# Patient Record
Sex: Female | Born: 1990 | Race: White | Hispanic: No | Marital: Married | Smoking: Never smoker
Health system: Southern US, Community
[De-identification: ages and names within clinical notes are randomized; demographics above are authoritative.]

## PROBLEM LIST (undated history)

## (undated) DIAGNOSIS — R519 Headache, unspecified: Secondary | ICD-10-CM

## (undated) DIAGNOSIS — N920 Excessive and frequent menstruation with regular cycle: Secondary | ICD-10-CM

## (undated) DIAGNOSIS — O24419 Gestational diabetes mellitus in pregnancy, unspecified control: Secondary | ICD-10-CM

## (undated) DIAGNOSIS — R51 Headache: Secondary | ICD-10-CM

## (undated) HISTORY — PX: TONSILLECTOMY: SHX5217

## (undated) HISTORY — DX: Gestational diabetes mellitus in pregnancy, unspecified control: O24.419

## (undated) HISTORY — DX: Headache: R51

## (undated) HISTORY — PX: TONSILLECTOMY: SUR1361

## (undated) HISTORY — DX: Headache, unspecified: R51.9

## (undated) HISTORY — DX: Excessive and frequent menstruation with regular cycle: N92.0

## (undated) HISTORY — PX: ARTHROSCOPIC REPAIR ACL: SUR80

---
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
## 2010-04-07 ENCOUNTER — Encounter: Payer: Self-pay | Admitting: Family Medicine

## 2012-03-04 ENCOUNTER — Ambulatory Visit (INDEPENDENT_AMBULATORY_CARE_PROVIDER_SITE_OTHER): Payer: BC Managed Care – PPO | Admitting: Internal Medicine

## 2012-03-04 ENCOUNTER — Encounter: Payer: Self-pay | Admitting: Internal Medicine

## 2012-03-04 VITALS — BP 128/82 | HR 93 | Temp 98.0°F | Ht 66.0 in | Wt 147.0 lb

## 2012-03-04 DIAGNOSIS — Z0000 Encounter for general adult medical examination without abnormal findings: Secondary | ICD-10-CM

## 2012-03-04 DIAGNOSIS — Z23 Encounter for immunization: Secondary | ICD-10-CM

## 2012-03-04 DIAGNOSIS — Z789 Other specified health status: Secondary | ICD-10-CM

## 2012-03-04 MED ORDER — TYPHOID VACCINE PO CPDR: 1.0000 | DELAYED_RELEASE_CAPSULE | ORAL | Status: DC

## 2012-03-24 NOTE — Progress Notes (Signed)
  Subjective:    Patient ID: CASSADY STANCZAK, female    DOB: March 07, 1991, 22 y.o.   MRN: [id number]  HPI  22yo F, no signficant past med hx going on extensive trip for 11 months. Leaving in July 2014-- Which will involve working [place] settings, working in Heritage manager, buidling homes, "adventures through mission". She is uptodate on childhood vaccines. Attending appt with her mother  Past med hx: acl repair of right knee, s/p bone graft, s/p tonsillectomy Meds: none All: nkma Social hx: student at [employer], major - Social research officer, government, no smoking or illicit drugs  Review of Systems     Objective:   Physical Exam        Assessment & Plan:   malaria prophylaxis = recommended malarone. Not interested in larium due to black box warning for psychosis.DEET and premethrin  Traveler's diarrhea = will give rx for cipro and azithro  Pre-travel vaccination = recommend oral typhoid, hep A (now and next dose in June), lnfluenza, japanese encephalitis, yellow fever, and rabies.  At this visit, she received YF, oral typhoid, flu and hep A #1. Will arrange for follow up visit after she finishes her semester to receive the other vaccines nad RX for malaria and TD.  Overall safety = recommended to enroll in state dept website nad ensure that they have evacuation insurance

## 2012-06-01 ENCOUNTER — Telehealth: Payer: Self-pay | Admitting: Obstetrics & Gynecology

## 2012-06-01 ENCOUNTER — Telehealth: Payer: Self-pay | Admitting: *Deleted

## 2012-06-01 MED ORDER — VALACYCLOVIR HCL 1 G PO TABS: 2000.0000 mg | ORAL_TABLET | Freq: Two times a day (BID) | ORAL | Status: DC

## 2012-06-01 NOTE — Telephone Encounter (Signed)
See previous note, refill completed and patients mother aware.

## 2012-06-01 NOTE — Telephone Encounter (Signed)
Call from patient's mother, Selena Batten, release to speak with mom in paper chart.  Patient is out of town and has fever blister and request refill on Valtrex that patient has taken in the past. Refill to [facility] in [place] per V/o Dr Hyacinth Meeker.

## 2012-06-01 NOTE — Telephone Encounter (Signed)
[facility]. [place], [place] [phone]

## 2012-07-06 ENCOUNTER — Telehealth: Payer: Self-pay | Admitting: Obstetrics & Gynecology

## 2012-07-06 NOTE — Telephone Encounter (Signed)
Pt called with a couple of questions regarding her new birth control, Maureen Carlson, which she started a month ago. She is spotting and cramping but is not supposed to be on her cycle at this time. Please call the patient.

## 2012-07-06 NOTE — Telephone Encounter (Signed)
Patient concerned of lite spotting and cramping since starting BC pill last month. Has about 2 week left on this pack. Explained this was common to see at this time with just starting BC pill. Patient cautioned to use a back up plan of protection for a month or two. Patient concerned is pregnant with these symptoms. She will purchase a home HCG test . Will call back if any more problems.

## 2012-07-06 NOTE — Telephone Encounter (Signed)
Left message on CB# of need to return call to our office.  sue 

## 2012-07-06 NOTE — Telephone Encounter (Signed)
no

## 2012-07-21 ENCOUNTER — Telehealth: Payer: Self-pay | Admitting: *Deleted

## 2012-07-21 NOTE — Telephone Encounter (Signed)
Patient made f/u appointment for 07/28/12 at previous visit.  Per patient's mother, she does not yet have her travel itinerary (leaving 09/14/12), but did decide that she would like the rabies series.  Patient's mother wants to know if she has enough time for this before her departure. Andree Coss, RN

## 2012-07-21 NOTE — Telephone Encounter (Signed)
i think the schedule is 0,7,14 and 21 or 28 days. She has time for the series

## 2012-07-22 NOTE — Telephone Encounter (Signed)
Great!  I'll let her know.

## 2012-07-23 NOTE — Telephone Encounter (Signed)
Hey michelle--  Correction on the rabies proph dosing : day 0, day 7, then day 21 or 28. Only 3 doses total.  Post-exposure proph is 4 doses

## 2012-07-28 ENCOUNTER — Ambulatory Visit (INDEPENDENT_AMBULATORY_CARE_PROVIDER_SITE_OTHER): Payer: BC Managed Care – PPO | Admitting: Internal Medicine

## 2012-07-28 ENCOUNTER — Ambulatory Visit: Payer: BC Managed Care – PPO | Admitting: Internal Medicine

## 2012-07-28 DIAGNOSIS — Z789 Other specified health status: Secondary | ICD-10-CM

## 2012-07-28 NOTE — Progress Notes (Signed)
[facility] NOTE  RFV: follow up for upcoming 11 month trip around the world Subjective:    Patient ID: Maureen Carlson, female    DOB: 08-Feb-1991, 22 y.o.   MRN: [id number]  HPI 22yo F previously came to travel clinic in December here for her 2nd hep A and planning vaccination for JE and rabies    Review of Systems     Objective:   Physical Exam        Assessment & Plan:  - gave hep A #2 - established scheduling for japanese encephalitis 2 doses ( #1 and day #29) - established schedule for rabies vaccine series (#1,#7 and #21) - will discuss with her itinerary to determine her malaria prophylaxis  (no charge)

## 2012-07-29 ENCOUNTER — Other Ambulatory Visit (INDEPENDENT_AMBULATORY_CARE_PROVIDER_SITE_OTHER): Payer: BC Managed Care – PPO | Admitting: *Deleted

## 2012-07-29 DIAGNOSIS — Z23 Encounter for immunization: Secondary | ICD-10-CM

## 2012-07-30 ENCOUNTER — Ambulatory Visit (INDEPENDENT_AMBULATORY_CARE_PROVIDER_SITE_OTHER): Payer: BC Managed Care – PPO | Admitting: *Deleted

## 2012-07-30 DIAGNOSIS — Z2911 Encounter for prophylactic immunotherapy for respiratory syncytial virus (RSV): Secondary | ICD-10-CM

## 2012-07-30 DIAGNOSIS — Z23 Encounter for immunization: Secondary | ICD-10-CM

## 2012-08-10 ENCOUNTER — Ambulatory Visit (INDEPENDENT_AMBULATORY_CARE_PROVIDER_SITE_OTHER): Payer: BC Managed Care – PPO | Admitting: *Deleted

## 2012-08-10 DIAGNOSIS — Z23 Encounter for immunization: Secondary | ICD-10-CM

## 2012-08-17 ENCOUNTER — Ambulatory Visit: Payer: BC Managed Care – PPO

## 2012-08-17 ENCOUNTER — Ambulatory Visit: Payer: BC Managed Care – PPO | Admitting: *Deleted

## 2012-08-17 DIAGNOSIS — Z789 Other specified health status: Secondary | ICD-10-CM

## 2012-08-25 ENCOUNTER — Other Ambulatory Visit (INDEPENDENT_AMBULATORY_CARE_PROVIDER_SITE_OTHER): Payer: BC Managed Care – PPO | Admitting: *Deleted

## 2012-08-25 DIAGNOSIS — Z23 Encounter for immunization: Secondary | ICD-10-CM

## 2012-08-27 ENCOUNTER — Ambulatory Visit (INDEPENDENT_AMBULATORY_CARE_PROVIDER_SITE_OTHER): Payer: BC Managed Care – PPO | Admitting: *Deleted

## 2012-08-27 DIAGNOSIS — Z2911 Encounter for prophylactic immunotherapy for respiratory syncytial virus (RSV): Secondary | ICD-10-CM

## 2012-08-27 DIAGNOSIS — Z23 Encounter for immunization: Secondary | ICD-10-CM

## 2012-08-31 ENCOUNTER — Ambulatory Visit (INDEPENDENT_AMBULATORY_CARE_PROVIDER_SITE_OTHER): Payer: BC Managed Care – PPO | Admitting: *Deleted

## 2012-08-31 ENCOUNTER — Telehealth: Payer: Self-pay | Admitting: *Deleted

## 2012-08-31 DIAGNOSIS — Z23 Encounter for immunization: Secondary | ICD-10-CM

## 2012-08-31 NOTE — Telephone Encounter (Signed)
Patient in for Rabies #3 of 3.  She leaves for her 1 year long mission 09/13/12.  She still has not yet been given an itinerary by the coordinators for malaria prevention.  She is pretty sure that the last 3 months of her trip will be in [place].  Other potential stops include [place] and [place], but her trip will begin in [place].  She needs advice as to how she could best prepare for malaria prevention without any more itinerary information.  She has been told that she can get medication along the way once she is in a malaria-prone area, but wondered if she should bring prophylaxis to begin 2 weeks before she is at risk.  Please advise.   Andree Coss, RN

## 2012-09-01 ENCOUNTER — Telehealth: Payer: Self-pay | Admitting: Obstetrics & Gynecology

## 2012-09-01 MED ORDER — FLUCONAZOLE 150 MG PO TABS: 150.0000 mg | ORAL_TABLET | Freq: Once | ORAL | Status: DC

## 2012-09-01 NOTE — Telephone Encounter (Signed)
Patient is going on am mission trip on June 29 th . Patient's mom called and said that Dr. Hyacinth Meeker had mention giving her daughter 74 months of BC Maureen Carlson) because her daughter is going to be gone for 1 year. She also needs 2 Diflucan.   [facility] [address].

## 2012-09-01 NOTE — Telephone Encounter (Signed)
Pt leaving the Country on a Mission Trip for 1 year,  was told she could possible get samples of Marygrace Drought And a Rx for Diflucan to take with her. I found samples of Beyaz x 1year. Is this ok?

## 2012-09-01 NOTE — Telephone Encounter (Signed)
Yes.  That is fine.  Rx sent to pharmacy.  When was last AEX?

## 2012-09-01 NOTE — Telephone Encounter (Signed)
Patient is going on mission trip on June 29

## 2012-09-02 ENCOUNTER — Ambulatory Visit: Payer: BC Managed Care – PPO | Admitting: Internal Medicine

## 2012-09-02 ENCOUNTER — Encounter: Payer: Self-pay | Admitting: *Deleted

## 2012-09-02 NOTE — Telephone Encounter (Signed)
NGYN/Aex 08/07/08

## 2012-09-02 NOTE — Telephone Encounter (Signed)
Appt made for 6/27 for AEX before pt leave country and gets samples.

## 2012-09-10 ENCOUNTER — Other Ambulatory Visit: Payer: Self-pay | Admitting: *Deleted

## 2012-09-10 ENCOUNTER — Ambulatory Visit: Payer: Self-pay | Admitting: Obstetrics & Gynecology

## 2012-09-10 DIAGNOSIS — Z01419 Encounter for gynecological examination (general) (routine) without abnormal findings: Secondary | ICD-10-CM

## 2012-09-10 DIAGNOSIS — Z23 Encounter for immunization: Secondary | ICD-10-CM

## 2012-09-10 MED ORDER — ATOVAQUONE-PROGUANIL HCL 250-100 MG PO TABS: 1.0000 | ORAL_TABLET | Freq: Every day | ORAL | Status: DC

## 2013-01-20 ENCOUNTER — Other Ambulatory Visit: Payer: Self-pay

## 2013-11-28 ENCOUNTER — Telehealth: Payer: Self-pay | Admitting: Obstetrics & Gynecology

## 2013-11-28 ENCOUNTER — Encounter: Payer: Self-pay | Admitting: Nurse Practitioner

## 2013-11-28 ENCOUNTER — Ambulatory Visit (INDEPENDENT_AMBULATORY_CARE_PROVIDER_SITE_OTHER): Payer: BC Managed Care – PPO | Admitting: Nurse Practitioner

## 2013-11-28 VITALS — BP 100/72 | HR 100 | Ht 66.0 in | Wt 165.0 lb

## 2013-11-28 DIAGNOSIS — B373 Candidiasis of vulva and vagina: Secondary | ICD-10-CM

## 2013-11-28 DIAGNOSIS — B3731 Acute candidiasis of vulva and vagina: Secondary | ICD-10-CM

## 2013-11-28 MED ORDER — FLUCONAZOLE 150 MG PO TABS: 150.0000 mg | ORAL_TABLET | Freq: Once | ORAL | Status: DC

## 2013-11-28 NOTE — Telephone Encounter (Signed)
Pt called with an yeast infection. Offered the 4:00 slot with Maureen Carlson but she would need to be done by 5:30. Since she was not sure of getting out on time and she cant come in tomorrow because of work please call to schedule.

## 2013-11-28 NOTE — Telephone Encounter (Signed)
Spoke with patient. Patient states that she has a yeast infection and would like to be seen today if possible or tomorrow after 2:30pm. Appointment scheduled for today at 3:30pm with Lauro Franklin, FNP. Patient agreeable to date and time and to seeing another provider.   Routing to [place], FNP as seeing patient. Cc: Verner Chol CNM   Routing to provider for final review. Patient agreeable to disposition. Will close encounter

## 2013-11-28 NOTE — Progress Notes (Signed)
Subjective:     Patient ID: Maureen Carlson, female   DOB: 11/28/1990, 23 y.o.   MRN: [id number]  HPI this  23 yo Fe complains of vaginitis symptoms since Saturday.  Main symptoms is vaginal discharge and itching.  No recent antibiotics.  Recent change in body wash and detergent. Same personal products.  Also going to the gym and not changing right away.  She is dating but never SA.  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   Review of Systems  Constitutional: Negative for fever, chills and fatigue.  Gastrointestinal: Negative for nausea, vomiting, abdominal pain, diarrhea, constipation and abdominal distention.  Genitourinary: Positive for vaginal discharge. Negative for dysuria, urgency, frequency, flank pain, menstrual problem and pelvic pain.  Musculoskeletal: Negative.   Skin: Negative.   Neurological: Negative.   Psychiatric/Behavioral: Negative.        Objective:   Physical Exam  Constitutional: She is oriented to person, place, and time. She appears well-developed and well-nourished. No distress.  Abdominal: Soft. She exhibits no distension. There is no tenderness. There is no rebound and no guarding.  Genitourinary:  Sliney thick vaginal discharge.  No other lesions or cervicitis.  Wet prep:  PH: 3.4; NSS: negative; KOH: + yeast.  Neurological: She is alert and oriented to person, place, and time.  Psychiatric: She has a normal mood and affect. Her behavior is normal. Judgment and thought content normal.       Assessment:     Yeast vaginitis    Plan:     Diflucan 150 mg X 2  If symptoms not relieved to call back

## 2013-11-28 NOTE — Patient Instructions (Signed)

## 2013-11-29 ENCOUNTER — Encounter: Payer: Self-pay | Admitting: Nurse Practitioner

## 2013-12-06 NOTE — Progress Notes (Signed)
Encounter reviewed by Dr. Brook Silva.  

## 2014-02-28 ENCOUNTER — Telehealth: Payer: Self-pay | Admitting: Obstetrics & Gynecology

## 2014-02-28 NOTE — Telephone Encounter (Signed)
Spoke with patient. Patient states that she would like to come in before January due to insurance to discuss starting on birth control. Appointment scheduled for 12/21 at 11:15am with Lauro FranklinPatricia Rolen-Grubb, FNP. Patient is agreeable to date and time and to see another provider.  Cc: Dr.Miller  Routing to provider for final review. Patient agreeable to disposition. Will close encounter

## 2014-02-28 NOTE — Telephone Encounter (Signed)
Pt called wanting to schedule an appointment to discuss birth control options with dr Hyacinth Meekermiller  bf

## 2014-03-06 ENCOUNTER — Ambulatory Visit (INDEPENDENT_AMBULATORY_CARE_PROVIDER_SITE_OTHER): Payer: BC Managed Care – PPO | Admitting: Nurse Practitioner

## 2014-03-06 ENCOUNTER — Encounter: Payer: Self-pay | Admitting: Nurse Practitioner

## 2014-03-06 VITALS — BP 108/60 | HR 76 | Resp 16 | Ht 66.0 in | Wt 172.4 lb

## 2014-03-06 DIAGNOSIS — Z308 Encounter for other contraceptive management: Secondary | ICD-10-CM

## 2014-03-06 NOTE — Patient Instructions (Signed)
Levonorgestrel intrauterine device (IUD) What is this medicine? LEVONORGESTREL IUD (LEE voe nor jes trel) is a contraceptive (birth control) device. The device is placed inside the uterus by a healthcare professional. It is used to prevent pregnancy and can also be used to treat heavy bleeding that occurs during your period. Depending on the device, it can be used for 3 to 5 years. This medicine may be used for other purposes; ask your health care provider or pharmacist if you have questions. COMMON BRAND NAME(S): LILETTA, Mirena, Skyla What should I tell my health care provider before I take this medicine? They need to know if you have any of these conditions: -abnormal Pap smear -cancer of the breast, uterus, or cervix -diabetes -endometritis -genital or pelvic infection now or in the past -have more than one sexual partner or your partner has more than one partner -heart disease -history of an ectopic or tubal pregnancy -immune system problems -IUD in place -liver disease or tumor -problems with blood clots or take blood-thinners -use intravenous drugs -uterus of unusual shape -vaginal bleeding that has not been explained -an unusual or allergic reaction to levonorgestrel, other hormones, silicone, or polyethylene, medicines, foods, dyes, or preservatives -pregnant or trying to get pregnant -breast-feeding How should I use this medicine? This device is placed inside the uterus by a health care professional. Talk to your pediatrician regarding the use of this medicine in children. Special care may be needed. Overdosage: If you think you have taken too much of this medicine contact a poison control center or emergency room at once. NOTE: This medicine is only for you. Do not share this medicine with others. What if I miss a dose? This does not apply. What may interact with this medicine? Do not take this medicine with any of the following  medications: -amprenavir -bosentan -fosamprenavir This medicine may also interact with the following medications: -aprepitant -barbiturate medicines for inducing sleep or treating seizures -bexarotene -griseofulvin -medicines to treat seizures like carbamazepine, ethotoin, felbamate, oxcarbazepine, phenytoin, topiramate -modafinil -pioglitazone -rifabutin -rifampin -rifapentine -some medicines to treat HIV infection like atazanavir, indinavir, lopinavir, nelfinavir, tipranavir, ritonavir -St. John's wort -warfarin This list may not describe all possible interactions. Give your health care provider a list of all the medicines, herbs, non-prescription drugs, or dietary supplements you use. Also tell them if you smoke, drink alcohol, or use illegal drugs. Some items may interact with your medicine. What should I watch for while using this medicine? Visit your doctor or health care professional for regular check ups. See your doctor if you or your partner has sexual contact with others, becomes HIV positive, or gets a sexual transmitted disease. This product does not protect you against HIV infection (AIDS) or other sexually transmitted diseases. You can check the placement of the IUD yourself by reaching up to the top of your vagina with clean fingers to feel the threads. Do not pull on the threads. It is a good habit to check placement after each menstrual period. Call your doctor right away if you feel more of the IUD than just the threads or if you cannot feel the threads at all. The IUD may come out by itself. You may become pregnant if the device comes out. If you notice that the IUD has come out use a backup birth control method like condoms and call your health care provider. Using tampons will not change the position of the IUD and are okay to use during your period. What side effects may   I notice from receiving this medicine? Side effects that you should report to your doctor or  health care professional as soon as possible: -allergic reactions like skin rash, itching or hives, swelling of the face, lips, or tongue -fever, flu-like symptoms -genital sores -high blood pressure -no menstrual period for 6 weeks during use -pain, swelling, warmth in the leg -pelvic pain or tenderness -severe or sudden headache -signs of pregnancy -stomach cramping -sudden shortness of breath -trouble with balance, talking, or walking -unusual vaginal bleeding, discharge -yellowing of the eyes or skin Side effects that usually do not require medical attention (report to your doctor or health care professional if they continue or are bothersome): -acne -breast pain -change in sex drive or performance -changes in weight -cramping, dizziness, or faintness while the device is being inserted -headache -irregular menstrual bleeding within first 3 to 6 months of use -nausea This list may not describe all possible side effects. Call your doctor for medical advice about side effects. You may report side effects to FDA at 1-800-FDA-1088. Where should I keep my medicine? This does not apply. NOTE: This sheet is a summary. It may not cover all possible information. If you have questions about this medicine, talk to your doctor, pharmacist, or health care provider.  2015, Elsevier/Gold Standard. (2011-04-03 13:54:04)  

## 2014-03-06 NOTE — Progress Notes (Signed)
Patient ID: Maureen Carlson, female   DOB: 03/24/1990, 23 y.o.   MRN: [id number] S.   This 23 yo G0 WS Fe here for a consult to discuss birth control. Menses is normal, lasting 5-7 days with cramps 1-2 days before menses starts and last for 4 days.  She would like to have birth control again for dysmenorrhea and now for birth control.  Taking Advil OTC with some relief of cramps but has to take a lot.   She was on Loestrin about age 23 and did Ok with a lighter flow and less cramps.  Later was given Maureen DroughtBeyaz but had BTB at mid cycle and no help with cramps.   Went on mission trip for 11 months.  A team of 40 people would go to a different country a month.  They would break up into smaller groups of 7.  She was with [employer].   Currently dating and SA using condoms for birth control.  LMP 03/02/14.  No history of STD's.  A: Birth control option  History of dysmenorrhea  Plan: Discussed various types of contraception including:  OCP, Nuva  Ring, Depo Provera, Nexplanon, IUD.    She is very interested in [place]. [place]yla IUD - information is given  Will place order and schedule consult visit with MD once we know that her insurance covers this.    Consult time: 15 minutes face to face.

## 2014-03-12 NOTE — Progress Notes (Signed)
Encounter reviewed by Dr. Brook Silva.  

## 2014-03-14 ENCOUNTER — Telehealth: Payer: Self-pay | Admitting: Nurse Practitioner

## 2014-03-14 NOTE — Telephone Encounter (Signed)
Spoke with patient. Advised that per benefits quote received, IUD and insertion is covered at 100%. There will be 0 patient liability. Patient is to call within the first 5 days of her cycle to schedule insertion. Patient agreeable.

## 2014-03-14 NOTE — Telephone Encounter (Signed)
Left message for patient to call back. Need to go over iud benefits. °Pr $0 °

## 2015-02-28 ENCOUNTER — Institutional Professional Consult (permissible substitution): Payer: Self-pay | Admitting: Nurse Practitioner

## 2015-03-06 ENCOUNTER — Encounter: Payer: Self-pay | Admitting: Nurse Practitioner

## 2015-03-06 ENCOUNTER — Ambulatory Visit (INDEPENDENT_AMBULATORY_CARE_PROVIDER_SITE_OTHER): Payer: BLUE CROSS/BLUE SHIELD | Admitting: Nurse Practitioner

## 2015-03-06 VITALS — BP 110/72 | HR 64 | Ht 65.5 in | Wt 149.0 lb

## 2015-03-06 DIAGNOSIS — Z3009 Encounter for other general counseling and advice on contraception: Secondary | ICD-10-CM | POA: Diagnosis not present

## 2015-03-06 DIAGNOSIS — Z01419 Encounter for gynecological examination (general) (routine) without abnormal findings: Secondary | ICD-10-CM | POA: Diagnosis not present

## 2015-03-06 DIAGNOSIS — Z0000 Encounter for general adult medical examination without abnormal findings: Secondary | ICD-10-CM | POA: Diagnosis not present

## 2015-03-06 NOTE — Progress Notes (Signed)
Patient ID: Maureen Carlson, female   DOB: 1990/06/24, 24 y.o.   MRN: [id number]  24 y.o. G0P0 Single-Engaged  Caucasian Fe here for annual exam.  Menses now at 4-5 days.  Cramps for 2 days. Some PMS.  She was seen last year and was very interested in [place] IUD.  She did not pursue but now that she is getting married in March wants to get it done.  They have tried not to be SA but occasionally will slip up but always uses condoms.  Patient's last menstrual period was 02/17/2015 (exact date).          Sexually active: Yes.  Not often, waiting until gets married. The current method of family planning is rhythm method.    Exercising: Yes.    aerobic, running and pilates every other day. Smoker:  no  Health Maintenance: Pap:  Never TDaP: 10/16/08 Gardasil: Completed 10/2010 Hep C and HIV: HIV drawn today, Hep C not indicated due to age Labs: HB: 12.1   Urine: Negative    reports that she has never smoked. She has never used smokeless tobacco. She reports that she drinks about 0.6 - 1.2 oz of alcohol per week. She reports that she does not use illicit drugs.  Past Medical History  Diagnosis Date  . Menorrhagia     Past Surgical History  Procedure Laterality Date  . Tonsillectomy      Current Outpatient Prescriptions  Medication Sig Dispense Refill  . Clindamycin-Benzoyl Per, Refr, gel   11  . tretinoin (RETIN-A) 0.025 % cream   11  . valACYclovir (VALTREX) 1000 MG tablet Take 2 tablets (2,000 mg total) by mouth every 12 (twelve) hours. 30 tablet 0   No current facility-administered medications for this visit.    Family History  Problem Relation Age of Onset  . Anxiety disorder Mother   . Anxiety disorder Paternal Uncle   . Multiple births Maternal Grandmother   . Cancer Paternal Grandfather   . Anxiety disorder Paternal Grandfather     ROS:  Pertinent items are noted in HPI.  Otherwise, a comprehensive ROS was negative.  Exam:   BP 110/72 mmHg  Pulse 64  Ht 5' 5.5" (1.664 m)   Wt 149 lb (67.586 kg)  BMI 24.41 kg/m2  LMP 02/17/2015 (Exact Date) Height: 5' 5.5" (166.4 cm) Ht Readings from Last 3 Encounters:  03/06/15 5' 5.5" (1.664 m)  03/06/14 5\' 6"  (1.676 m)  11/28/13 5\' 6"  (1.676 m)    General appearance: alert, cooperative and appears stated age Head: Normocephalic, without obvious abnormality, atraumatic Neck: no adenopathy, supple, symmetrical, trachea midline and thyroid normal to inspection and palpation Lungs: clear to auscultation bilaterally Breasts: normal appearance, no masses or tenderness Heart: regular rate and rhythm Abdomen: soft, non-tender; no masses,  no organomegaly Extremities: extremities normal, atraumatic, no cyanosis or edema Skin: Skin color, texture, turgor normal. No rashes or lesions Lymph nodes: Cervical, supraclavicular, and axillary nodes normal. No abnormal inguinal nodes palpated Neurologic: Grossly normal   Pelvic: External genitalia:  no lesions              Urethra:  normal appearing urethra with no masses, tenderness or lesions              Bartholin's and Skene's: normal                 Vagina: normal appearing vagina with normal color and discharge, no lesions  Cervix: anteverted              Pap taken: Yes.   Bimanual Exam:  Uterus:  normal size, contour, position, consistency, mobility, non-tender              Adnexa: no mass, fullness, tenderness               Rectovaginal: Confirms               Anus:  normal sphincter tone, no lesions  Chaperone present: yes   A:  Well Woman with normal exam  History of dysmenorrhea  Will go ahead and get STD's  Counseling about BC options  P:   Reviewed health and wellness pertinent to exam  Pap smear as above  Order is placed for Skyla IUD and she is given information to check on insurance coverage. Note is sent to Dr. Hyacinth Meeker to see if a consult needs to be done.  Counseled on breast self exam, STD prevention, HIV risk factors and prevention, adequate  intake of calcium and vitamin D, diet and exercise return annually or prn  An After Visit Summary was printed and given to the patient.

## 2015-03-06 NOTE — Patient Instructions (Signed)
General topics  Next pap or exam is  due in 1 year Take a Women's multivitamin Take 1200 mg. of calcium daily - prefer dietary If any concerns in interim to call back  Breast Self-Awareness Practicing breast self-awareness may pick up problems early, prevent significant medical complications, and possibly save your life. By practicing breast self-awareness, you can become familiar with how your breasts look and feel and if your breasts are changing. This allows you to notice changes early. It can also offer you some reassurance that your breast health is good. One way to learn what is normal for your breasts and whether your breasts are changing is to do a breast self-exam. If you find a lump or something that was not present in the past, it is best to contact your caregiver right away. Other findings that should be evaluated by your caregiver include nipple discharge, especially if it is bloody; skin changes or reddening; areas where the skin seems to be pulled in (retracted); or new lumps and bumps. Breast pain is seldom associated with cancer (malignancy), but should also be evaluated by a caregiver. BREAST SELF-EXAM The best time to examine your breasts is 5 7 days after your menstrual period is over.  ExitCare Patient Information 2013 [employer].   Exercise to Stay Healthy Exercise helps you become and stay healthy. EXERCISE IDEAS AND TIPS Choose exercises that:  You enjoy.  Fit into your day. You do not need to exercise really hard to be healthy. You can do exercises at a slow or medium level and stay healthy. You can:  Stretch before and after working out.  Try yoga, Pilates, or tai chi.  Lift weights.  Walk fast, swim, jog, run, climb stairs, bicycle, dance, or rollerskate.  Take aerobic classes. Exercises that burn about 150 calories:  Running 1  miles in 15 minutes.  Playing volleyball for 45 to 60 minutes.  Washing and waxing a car for 45 to 60  minutes.  Playing touch football for 45 minutes.  Walking 1  miles in 35 minutes.  Pushing a stroller 1  miles in 30 minutes.  Playing basketball for 30 minutes.  Raking leaves for 30 minutes.  Bicycling 5 miles in 30 minutes.  Walking 2 miles in 30 minutes.  Dancing for 30 minutes.  Shoveling snow for 15 minutes.  Swimming laps for 20 minutes.  Walking up stairs for 15 minutes.  Bicycling 4 miles in 15 minutes.  Gardening for 30 to 45 minutes.  Jumping rope for 15 minutes.  Washing windows or floors for 45 to 60 minutes. Document Released: 04/05/2010 Document Revised: 05/26/2011 Document Reviewed: 04/05/2010 ExitCare Patient Information 2013 ExitCare[employer].   Other topics ( that may be useful information):    Sexually Transmitted Disease Sexually transmitted disease (STD) refers to any infection that is passed from person to person during sexual activity. This may happen by way of saliva, semen, blood, vaginal mucus, or urine. Common STDs include:  Gonorrhea.  Chlamydia.  Syphilis.  HIV/AIDS.  Genital herpes.  Hepatitis B and C.  Trichomonas.  Human papillomavirus (HPV).  Pubic lice. CAUSES  An STD may be spread by bacteria, virus, or parasite. A person can get an STD by:  Sexual intercourse with an infected person.  Sharing sex toys with an infected person.  Sharing needles with an infected person.  Having intimate contact with the genitals, mouth, or rectal areas of an infected person. SYMPTOMS  Some people may not have any symptoms, but   they can still pass the infection to others. Different STDs have different symptoms. Symptoms include:  Painful or bloody urination.  Pain in the pelvis, abdomen, vagina, anus, throat, or eyes.  Skin rash, itching, irritation, growths, or sores (lesions). These usually occur in the genital or anal area.  Abnormal vaginal discharge.  Penile discharge in men.  Soft, flesh-colored skin growths in the  genital or anal area.  Fever.  Pain or bleeding during sexual intercourse.  Swollen glands in the groin area.  Yellow skin and eyes (jaundice). This is seen with hepatitis. DIAGNOSIS  To make a diagnosis, your caregiver may:  Take a medical history.  Perform a physical exam.  Take a specimen (culture) to be examined.  Examine a sample of discharge under a microscope.  Perform blood test TREATMENT   Chlamydia, gonorrhea, trichomonas, and syphilis can be cured with antibiotic medicine.  Genital herpes, hepatitis, and HIV can be treated, but not cured, with prescribed medicines. The medicines will lessen the symptoms.  Genital warts from HPV can be treated with medicine or by freezing, burning (electrocautery), or surgery. Warts may come back.  HPV is a virus and cannot be cured with medicine or surgery.However, abnormal areas may be followed very closely by your caregiver and may be removed from the cervix, vagina, or vulva through office procedures or surgery. If your diagnosis is confirmed, your recent sexual partners need treatment. This is true even if they are symptom-free or have a negative culture or evaluation. They should not have sex until their caregiver says it is okay. HOME CARE INSTRUCTIONS  All sexual partners should be informed, tested, and treated for all STDs.  Take your antibiotics as directed. Finish them even if you start to feel better.  Only take over-the-counter or prescription medicines for pain, discomfort, or fever as directed by your caregiver.  Rest.  Eat a balanced diet and drink enough fluids to keep your urine clear or pale yellow.  Do not have sex until treatment is completed and you have followed up with your caregiver. STDs should be checked after treatment.  Keep all follow-up appointments, Pap tests, and blood tests as directed by your caregiver.  Only use latex condoms and water-soluble lubricants during sexual activity. Do not use  petroleum jelly or oils.  Avoid alcohol and illegal drugs.  Get vaccinated for HPV and hepatitis. If you have not received these vaccines in the past, talk to your caregiver about whether one or both might be right for you.  Avoid risky sex practices that can break the skin. The only way to avoid getting an STD is to avoid all sexual activity.Latex condoms and dental dams (for oral sex) will help lessen the risk of getting an STD, but will not completely eliminate the risk. SEEK MEDICAL CARE IF:   You have a fever.  You have any new or worsening symptoms. Document Released: 05/24/2002 Document Revised: 05/26/2011 Document Reviewed: 05/31/2010 [facility] Patient Information 2013 [employer].    Domestic Abuse You are being battered or abused if someone close to you hits, pushes, or physically hurts you in any way. You also are being abused if you are forced into activities. You are being sexually abused if you are forced to have sexual contact of any kind. You are being emotionally abused if you are made to feel worthless or if you are constantly threatened. It is important to remember that help is available. No one has the right to abuse you. PREVENTION OF FURTHER  ABUSE  Learn the warning signs of danger. This varies with situations but may include: the use of alcohol, threats, isolation from friends and family, or forced sexual contact. Leave if you feel that violence is going to occur.  If you are attacked or beaten, report it to the police so the abuse is documented. You do not have to press charges. The police can protect you while you or the attackers are leaving. Get the officer's name and badge number and a copy of the report.  Find someone you can trust and tell them what is happening to you: your caregiver, a nurse, clergy member, close friend or family member. Feeling ashamed is natural, but remember that you have done nothing wrong. No one deserves abuse. Document Released:  02/29/2000 Document Revised: 05/26/2011 Document Reviewed: 05/09/2010 ExitCare Patient Information 2013 [employer]Care[employer].    How Much is Too Much Alcohol? Drinking too much alcohol can cause injury, accidents, and health problems. These types of problems can include:   Car crashes.  Falls.  Family fighting (domestic violence).  Drowning.  Fights.  Injuries.  Burns.  Damage to certain organs.  Having a baby with birth defects. ONE DRINK CAN BE TOO MUCH WHEN YOU ARE:  Working.  Pregnant or breastfeeding.  Taking medicines. Ask your doctor.  Driving or planning to drive. If you or someone you know has a drinking problem, get help from a doctor.  Document Released: 12/28/2008 Document Revised: 05/26/2011 Document Reviewed: 12/28/2008 ExitCare Patient Information 2013 [employer]Care[employer].   Smoking Hazards Smoking cigarettes is extremely bad for your health. Tobacco smoke has over 200 known poisons in it. There are over 60 chemicals in tobacco smoke that cause cancer. Some of the chemicals found in cigarette smoke include:   Cyanide.  Benzene.  Formaldehyde.  Methanol (wood alcohol).  Acetylene (fuel used in welding torches).  Ammonia. Cigarette smoke also contains the poisonous gases nitrogen oxide and carbon monoxide.  Cigarette smokers have an increased risk of many serious medical problems and Smoking causes approximately:  90% of all lung cancer deaths in men.  80% of all lung cancer deaths in women.  90% of deaths from chronic obstructive lung disease. Compared with nonsmokers, smoking increases the risk of:  Coronary heart disease by 2 to 4 times.  Stroke by 2 to 4 times.  Men developing lung cancer by 23 times.  Women developing lung cancer by 13 times.  Dying from chronic obstructive lung diseases by 12 times.  . Smoking is the most preventable cause of death and disease in our society.  WHY IS SMOKING ADDICTIVE?  Nicotine is the chemical  agent in tobacco that is capable of causing addiction or dependence.  When you smoke and inhale, nicotine is absorbed rapidly into the bloodstream through your lungs. Nicotine absorbed through the lungs is capable of creating a powerful addiction. Both inhaled and non-inhaled nicotine may be addictive.  Addiction studies of cigarettes and spit tobacco show that addiction to nicotine occurs mainly during the teen years, when young people begin using tobacco products. WHAT ARE THE BENEFITS OF QUITTING?  There are many health benefits to quitting smoking.   Likelihood of developing cancer and heart disease decreases. Health improvements are seen almost immediately.  Blood pressure, pulse rate, and breathing patterns start returning to normal soon after quitting. QUITTING SMOKING   American Lung Association - 1-800-LUNGUSA  American Cancer Society - 1-800-ACS-2345 Document Released: 04/10/2004 Document Revised: 05/26/2011 Document Reviewed: 12/13/2008 ExitCare Patient Information 2013 ExitCare,   LLC.   Stress Management Stress is a state of physical or mental tension that often results from changes in your life or normal routine. Some common causes of stress are:  Death of a loved one.  Injuries or severe illnesses.  Getting fired or changing jobs.  Moving into a new home. Other causes may be:  Sexual problems.  Business or financial losses.  Taking on a large debt.  Regular conflict with someone at home or at work.  Constant tiredness from lack of sleep. It is not just bad things that are stressful. It may be stressful to:  Win the lottery.  Get married.  Buy a new car. The amount of stress that can be easily tolerated varies from person to person. Changes generally cause stress, regardless of the types of change. Too much stress can affect your health. It may lead to physical or emotional problems. Too little stress (boredom) may also become stressful. SUGGESTIONS TO  REDUCE STRESS:  Talk things over with your family and friends. It often is helpful to share your concerns and worries. If you feel your problem is serious, you may want to get help from a professional counselor.  Consider your problems one at a time instead of lumping them all together. Trying to take care of everything at once may seem impossible. List all the things you need to do and then start with the most important one. Set a goal to accomplish 2 or 3 things each day. If you expect to do too many in a single day you will naturally fail, causing you to feel even more stressed.  Do not use alcohol or drugs to relieve stress. Although you may feel better for a short time, they do not remove the problems that caused the stress. They can also be habit forming.  Exercise regularly - at least 3 times per week. Physical exercise can help to relieve that "uptight" feeling and will relax you.  The shortest distance between despair and hope is often a good night's sleep.  Go to bed and get up on time allowing yourself time for appointments without being rushed.  Take a short "time-out" period from any stressful situation that occurs during the day. Close your eyes and take some deep breaths. Starting with the muscles in your face, tense them, hold it for a few seconds, then relax. Repeat this with the muscles in your neck, shoulders, hand, stomach, back and legs.  Take good care of yourself. Eat a balanced diet and get plenty of rest.  Schedule time for having fun. Take a break from your daily routine to relax. HOME CARE INSTRUCTIONS   Call if you feel overwhelmed by your problems and feel you can no longer manage them on your own.  Return immediately if you feel like hurting yourself or someone else. Document Released: 08/27/2000 Document Revised: 05/26/2011 Document Reviewed: 04/19/2007 ExitCare Patient Information 2013 ExitCare, LLC.  

## 2015-03-06 NOTE — Progress Notes (Signed)
Reviewed personally.  M. Suzanne Lusine Corlett, MD.  

## 2015-03-07 LAB — STD PANEL
HIV: NONREACTIVE
Hepatitis B Surface Ag: NEGATIVE

## 2015-03-08 ENCOUNTER — Telehealth: Payer: Self-pay | Admitting: Nurse Practitioner

## 2015-03-08 LAB — IPS PAP TEST WITH REFLEX TO HPV

## 2015-03-08 LAB — IPS N GONORRHOEA AND CHLAMYDIA BY PCR

## 2015-03-08 NOTE — Telephone Encounter (Signed)
Spoke with pt regarding benefit for IUD insertion. Patient understood and agreeable. Patient to call back the first day of her cycle to schedule.No further questions. Ok to close

## 2015-03-13 ENCOUNTER — Telehealth: Payer: Self-pay | Admitting: Emergency Medicine

## 2015-03-13 NOTE — Telephone Encounter (Signed)
Call to patient. Advised she can call for an IUD-Skyla appointment with the first day of her cycle and appointment instructions.  Patient agreeable.   Routing to [employer] for IUD pre-cert.  Routing to provider for final review. Patient agreeable to disposition. Will close encounter.

## 2015-03-13 NOTE — Telephone Encounter (Signed)
-----   Message from Jerene BearsMary S Miller, MD sent at 03/06/2015 10:16 AM EST ----- I do not need to consult with her first.  Thanks.  Don't know who is going to schedule this with the pt.  Sending to Ashok PallKaitlyn and Lakisa Lotz. ----- Message -----    From: Ria CommentPatricia Grubb, FNP    Sent: 03/06/2015   9:53 AM      To: Jerene BearsMary S Miller, MD  You already now this patient - do you want to consult before IUD - please let nurse know to schedule.

## 2015-03-20 ENCOUNTER — Telehealth: Payer: Self-pay | Admitting: Nurse Practitioner

## 2015-03-20 MED ORDER — MISOPROSTOL 200 MCG PO TABS: ORAL_TABLET | ORAL | Status: DC

## 2015-03-20 NOTE — Telephone Encounter (Signed)
Call to patient. Started cycle 03/19/15. Ready to schedule Skyla IUD insertion with Dr. Hyacinth MeekerMiller. Scheduled Skyla IUD insertion for 03/22/15.  Pre procedure instructions given.  Motrin instructions given. Motrin=Advil=Ibuprofen, 800 mg one hour before appointment. Eat a meal and hydrate well before appointment. Take Cytotec 200 mcg tablet.  1 tablet PV night before the procedure.  1 tablet PV the morning of the procedure.   Patient verbalized understanding of instructions. Will call back prn.  Routing to provider for final review. Patient agreeable to disposition. Will close encounter.

## 2015-03-20 NOTE — Telephone Encounter (Signed)
Patient called she just started cycle today and is ready to schedule IUD insertion. Best # to reach: [phone]

## 2015-03-22 ENCOUNTER — Encounter: Payer: Self-pay | Admitting: Obstetrics & Gynecology

## 2015-03-22 ENCOUNTER — Telehealth: Payer: Self-pay | Admitting: Obstetrics & Gynecology

## 2015-03-22 ENCOUNTER — Ambulatory Visit (INDEPENDENT_AMBULATORY_CARE_PROVIDER_SITE_OTHER): Payer: BLUE CROSS/BLUE SHIELD | Admitting: Obstetrics & Gynecology

## 2015-03-22 VITALS — BP 90/60 | HR 66 | Resp 18 | Ht 65.5 in | Wt 148.0 lb

## 2015-03-22 DIAGNOSIS — Z30430 Encounter for insertion of intrauterine contraceptive device: Secondary | ICD-10-CM

## 2015-03-22 DIAGNOSIS — Z3009 Encounter for other general counseling and advice on contraception: Secondary | ICD-10-CM

## 2015-03-22 LAB — POCT URINE PREGNANCY: PREG TEST UR: NEGATIVE

## 2015-03-22 NOTE — Progress Notes (Signed)
25 y.o. 700P0000 Engaged Caucasian female presents for  insertion of IUD. She has been counseled about alternative forms of birth control including oral contraceptives, progesterone methods, IUD, barrier method, and sterilization.  She has many questions which were all addressed today.  After discussion, she decided to proceed with IUD placement.  Currently, she denies any vaginal symptoms or STD concerns.  GC/Chl testing done 03/06/15  LMP:  03/19/15  Gen:  WNWF healthy female NAD Abdomen: soft, non-tender Groin:  no inguinal nodes palpated  Pelvic exam: Vulva:  normal female genitalia Vagina:  normal vagina Cervix:  Non-tender, Negative CMT, no lesions or redness Uterus:  normal shape, position and consistency   After patient read information booklet and all questions were answered, informed consent was obtained.      Procedure:  Speculum inserted into vagina. Cervix visualized and cleansed with betadine solution X 3. Paracervical block placed:  yes, 1% Lidocaine instilled in the cx at 3 and 9 o'clock positions, 8 cc total used.  Tenaculum placed on cervix at 12 o'clock position.  Uterus sounded to 8 centimeters.  Skyla IUD and inserting device removed from sterile packet and under sterile conditions inserted to fundus of uterus.  IUD released and introducer removed without difficulty.  IUD string trimmed to 2 centimeters.  Remainder string given to patient to feel for identification.  Tenaculum removed.  Minimal bleeding noted.  Speculum removed.  Uterus palpated normal.  Patient tolerated procedure well.  IUD Lot #:[id number].  Exp: 2/17.  Package information attached to consent and scanned into EPIC.  A: Insertion of Skyla IUD   P: Return to office 4-6 weeks for recheck      Pt knows IUD needs to be replaced approximately 3 years, no later than 03/21/2018.  Instructions provided.

## 2015-03-22 NOTE — Telephone Encounter (Signed)
Left message for pt to call and schedule her 6-8 week iud recheck appointment. Patient was seen today and failed to stop by check out to schedule.

## 2015-03-23 ENCOUNTER — Institutional Professional Consult (permissible substitution): Payer: Self-pay | Admitting: Obstetrics & Gynecology

## 2015-03-23 ENCOUNTER — Telehealth: Payer: Self-pay | Admitting: *Deleted

## 2015-03-23 ENCOUNTER — Encounter: Payer: Self-pay | Admitting: *Deleted

## 2015-03-23 NOTE — Telephone Encounter (Signed)
See previous phone encounter. Note for employer reviewed and approved by Dr Hyacinth MeekerMiller. Left at desk for patient family member to pick up.  Routing to provider for final review. Will close encounter.

## 2015-03-23 NOTE — Telephone Encounter (Signed)
Patient's mother, Selena BattenKim, ( on ROI and patient gave verbal permission in office to communicate with mother)  called me personally last evening, approx 2030. Patient feeling much better this evening and they are appreciative of extra care provided post procedure. Requested note for employer excusing her for work on 03-22-15 (date of procedure). Initial request was to email the note but advised could not email patient information due to HIPPA constraints. Patient's father will come by office on 03-23-15 to pick up note. Instructed Kim to have patient call if she has any concerns.   Note to your office for review/signature.

## 2015-05-15 ENCOUNTER — Ambulatory Visit (INDEPENDENT_AMBULATORY_CARE_PROVIDER_SITE_OTHER): Payer: BLUE CROSS/BLUE SHIELD | Admitting: Obstetrics & Gynecology

## 2015-05-15 ENCOUNTER — Ambulatory Visit (INDEPENDENT_AMBULATORY_CARE_PROVIDER_SITE_OTHER): Payer: BLUE CROSS/BLUE SHIELD

## 2015-05-15 ENCOUNTER — Encounter: Payer: Self-pay | Admitting: Obstetrics & Gynecology

## 2015-05-15 VITALS — BP 96/60 | HR 62 | Resp 14 | Ht 65.5 in | Wt 138.0 lb

## 2015-05-15 DIAGNOSIS — R102 Pelvic and perineal pain: Secondary | ICD-10-CM

## 2015-05-15 DIAGNOSIS — Z30431 Encounter for routine checking of intrauterine contraceptive device: Secondary | ICD-10-CM | POA: Diagnosis not present

## 2015-05-15 NOTE — Progress Notes (Signed)
25 y.o. Single Caucasian female [id number] here for follow up after [facility] IUD placement.  Pt reports a fair amount of spotting the first couple of weeks after placement.  This has continued to improve.  She did have a cycle at the beginning of February but this was lighter and a little longer then a "typical cycle" for her.  She does report some pain/cramping with and right after intercourse.  It does not occur all of the time and she cannot make any associations except for placemen of the IUD.  Denies vaginal discharge or odor.  Review of Systems  All other systems reviewed and are negative.  Physical Exam  Constitutional: She is oriented to person, place, and time. She appears well-developed.  Abdominal: Soft. Bowel sounds are normal.  Genitourinary: Vagina normal and uterus normal. There is no rash, tenderness, lesion or injury on the right labia. There is no rash, tenderness, lesion or injury on the left labia. Cervix exhibits no motion tenderness, no discharge and no friability. Right adnexum displays no mass, no tenderness and no fullness. Left adnexum displays no mass, no tenderness and no fullness.  Cannot see IUD string on exam.  Lymphadenopathy:       Right: No inguinal adenopathy present.       Left: No inguinal adenopathy present.  Neurological: She is alert and oriented to person, place, and time.  Psychiatric: She has a normal mood and affect.   As pt is getting married in a little over a week, is having some atypical pain, and has IUD placed 03/22/15, feel PUS should be done.  This could be obtained in-office today.  Uterus:  8.1 x 5.8 x 5.1cm.  IUD string is within 1cm of the external os Endometrium:  Thin with IUD in correct location Left ovary:  3.7 x 2.6 x 1.7cm Right ovary:  3.7 x 2.0 x 2.1cm Cul de sac:  No free fluid  Reviewed findings with pt.  For now, she will continue to monitor over the next couple of months.  If her bleeding changes/worsens or pain does not improve, she  knows to call back.  Also, pt aware she can have removed at any time but I would highly recommend waiting a few more months before deciding to have IUD removed.  Pt in agreement.  A:IUD check Non-visualized IUD string Pelvic pain in female, since IUD placement, exam not consistent with PID and hx of neg GC/Chl testing 12/16  P: Pt will call and give update in two months.  She knows reasons to call and be seen prior to that time.  ~20 minutes spent with patient >50% of time was in face to face discussion of above.

## 2015-10-08 DIAGNOSIS — L84 Corns and callosities: Secondary | ICD-10-CM | POA: Diagnosis not present

## 2015-12-08 DIAGNOSIS — H6500 Acute serous otitis media, unspecified ear: Secondary | ICD-10-CM | POA: Diagnosis not present

## 2015-12-08 DIAGNOSIS — B9789 Other viral agents as the cause of diseases classified elsewhere: Secondary | ICD-10-CM | POA: Diagnosis not present

## 2015-12-08 DIAGNOSIS — J069 Acute upper respiratory infection, unspecified: Secondary | ICD-10-CM | POA: Diagnosis not present

## 2016-03-17 NOTE — L&D Delivery Note (Signed)
Delivery Note At 1:12 PM a viable and healthy female was delivered via Vaginal, Spontaneous Delivery (Presentation: LOA  ).  APGAR: 8, 9; weight  pending.   Placenta status: spontaneous, intact.  Cord:  with the following complications: none.  Cord pH: na  Anesthesia:  epidural Episiotomy:  na Lacerations:  Second, vaginal Suture Repair: 2.0 3.0 vicryl rapide Est. Blood Loss (mL):    Mom to postpartum.  Baby to Couplet care / Skin to Skin.  [name] J 12/24/2016, 1:35 PM

## 2016-03-19 ENCOUNTER — Telehealth: Payer: Self-pay | Admitting: Nurse Practitioner

## 2016-03-19 DIAGNOSIS — Z30432 Encounter for removal of intrauterine contraceptive device: Secondary | ICD-10-CM

## 2016-03-19 NOTE — Telephone Encounter (Signed)
Patient has appointment for aex on 03/25/16 she is requesting to have her IUD removed that day.

## 2016-03-19 NOTE — Telephone Encounter (Signed)
Spoke with patient. Patient states that she and her husband would like to start trying for pregnancy. Would like IUD removed. Asking if this can be done at her aex on 03/25/2016 with Ria CommentPatricia Grubb, FNP. Advised patient Ria CommentPatricia Grubb, FNP does not remove IUDs. Advised may switch her aex to be with an MD and also have IUD pulled if she feels comfortable. Patient is agreeable. Aex moved to 03/25/2016 at 9 am with Dr.Miller. Order placed for IUD removal.  Routing to provider for final review. Patient agreeable to disposition. Will close encounter.

## 2016-03-25 ENCOUNTER — Ambulatory Visit: Payer: BLUE CROSS/BLUE SHIELD | Admitting: Nurse Practitioner

## 2016-03-25 ENCOUNTER — Encounter: Payer: Self-pay | Admitting: Obstetrics & Gynecology

## 2016-03-25 ENCOUNTER — Ambulatory Visit (INDEPENDENT_AMBULATORY_CARE_PROVIDER_SITE_OTHER): Payer: BLUE CROSS/BLUE SHIELD | Admitting: Obstetrics & Gynecology

## 2016-03-25 VITALS — BP 120/74 | HR 76 | Resp 14 | Ht 65.5 in | Wt 132.0 lb

## 2016-03-25 DIAGNOSIS — Z0184 Encounter for antibody response examination: Secondary | ICD-10-CM

## 2016-03-25 DIAGNOSIS — Z0000 Encounter for general adult medical examination without abnormal findings: Secondary | ICD-10-CM

## 2016-03-25 DIAGNOSIS — Z01419 Encounter for gynecological examination (general) (routine) without abnormal findings: Secondary | ICD-10-CM | POA: Diagnosis not present

## 2016-03-25 DIAGNOSIS — Z30432 Encounter for removal of intrauterine contraceptive device: Secondary | ICD-10-CM

## 2016-03-25 LAB — POCT URINALYSIS DIPSTICK
BILIRUBIN UA: NEGATIVE
Blood, UA: NEGATIVE
Glucose, UA: NEGATIVE
Ketones, UA: NEGATIVE
LEUKOCYTES UA: NEGATIVE
Nitrite, UA: NEGATIVE
Protein, UA: NEGATIVE
Urobilinogen, UA: NEGATIVE
pH, UA: 5

## 2016-03-25 MED ORDER — VALACYCLOVIR HCL 1 G PO TABS: ORAL_TABLET | ORAL | 1 refills | Status: DC

## 2016-03-25 NOTE — Progress Notes (Signed)
26 y.o. G0P0000 SingleCaucasianF here for annual exam.  Has an IUD and desires removal as she and husband want to try for pregnancy.  Cycles are regular and very light.    Patient's last menstrual period was 02/20/2016.          Sexually active: Yes.    The current method of family planning is IUD.    Exercising: Yes.    cardio, weights  Smoker:  no  Health Maintenance: Pap:  03/06/15 negative History of abnormal Pap:  no MMG:  never Colonoscopy:  never BMD:   never TDaP:  10/16/08  Pneumonia vaccine(s):  never Zostavax:   never Hep C testing: not indicated  Screening Labs: discuss with provider, Hb today: same, Urine today: normal    reports that she has never smoked. She has never used smokeless tobacco. She reports that she drinks about 0.6 - 1.2 oz of alcohol per week . She reports that she does not use drugs.  Past Medical History:  Diagnosis Date  . Menorrhagia     Past Surgical History:  Procedure Laterality Date  . TONSILLECTOMY      Current Outpatient Prescriptions  Medication Sig Dispense Refill  . valACYclovir (VALTREX) 1000 MG tablet Take 2 tablets (2,000 mg total) by mouth every 12 (twelve) hours. 30 tablet 0   No current facility-administered medications for this visit.     Family History  Problem Relation Age of Onset  . Anxiety disorder Mother   . Anxiety disorder Paternal Uncle   . Multiple births Maternal Grandmother   . Cancer Paternal Grandfather   . Anxiety disorder Paternal Grandfather     ROS:  Pertinent items are noted in HPI.  Otherwise, a comprehensive ROS was negative.  Exam:   BP 120/74 (BP Location: Right Arm, Patient Position: Sitting, Cuff Size: Normal)   Pulse 76   Resp 14   Ht 5' 5.5" (1.664 m)   Wt 132 lb (59.9 kg)   LMP 02/20/2016   BMI 21.63 kg/m   Weight change: @WEIGHTCHANGE @ Height:   Height: 5' 5.5" (166.4 cm)  Ht Readings from Last 3 Encounters:  03/25/16 5' 5.5" (1.664 m)  05/15/15 5' 5.5" (1.664 m)  03/22/15 5'  5.5" (1.664 m)    General appearance: alert, cooperative and appears stated age Head: Normocephalic, without obvious abnormality, atraumatic Neck: no adenopathy, supple, symmetrical, trachea midline and thyroid normal to inspection and palpation Lungs: clear to auscultation bilaterally Breasts: normal appearance, no masses or tenderness Heart: regular rate and rhythm Abdomen: soft, non-tender; bowel sounds normal; no masses,  no organomegaly Extremities: extremities normal, atraumatic, no cyanosis or edema Skin: Skin color, texture, turgor normal. No rashes or lesions Lymph nodes: Cervical, supraclavicular, and axillary nodes normal. No abnormal inguinal nodes palpated Neurologic: Grossly normal   Pelvic: External genitalia:  no lesions              Urethra:  normal appearing urethra with no masses, tenderness or lesions              Bartholins and Skenes: normal                 Vagina: normal appearing vagina with normal color and discharge, no lesions              Cervix: no lesions              Pap taken: No. Bimanual Exam:  Uterus:  normal size, contour, position, consistency, mobility, non-tender  Adnexa: normal adnexa and no mass, fullness, tenderness               Rectovaginal: Confirms               Anus:  normal sphincter tone, no lesions  Procedure:  Speculum replaced.  IUD strings not seen.  Cervix cleansed with Betadine x 3.  Single toothed tenaculum applied to anterior lip of cervix.  IUD hook passed through the os and IUD string obtained.  Then using ringed forceps, IUD removed completely and without difficulty.  Pt tolerated procedure well.  Speculum removed.  Minimal spotting noted.  Chaperone was present for exam.  A:  Well Woman with normal exam Desires IUD removal today  P:   Mammogram guidelines reviewed Pap smear normal 2016.   Rubella antibody today IUD removed today Pt will start PNV and call if not pregnant in six months. Return annually or  prn

## 2016-03-26 LAB — RUBELLA SCREEN: Rubella: 1.76 Index — ABNORMAL HIGH (ref <0.90)

## 2016-04-11 DIAGNOSIS — J101 Influenza due to other identified influenza virus with other respiratory manifestations: Secondary | ICD-10-CM | POA: Diagnosis not present

## 2016-04-11 DIAGNOSIS — R509 Fever, unspecified: Secondary | ICD-10-CM | POA: Diagnosis not present

## 2016-04-29 ENCOUNTER — Ambulatory Visit (INDEPENDENT_AMBULATORY_CARE_PROVIDER_SITE_OTHER): Payer: BLUE CROSS/BLUE SHIELD | Admitting: Obstetrics & Gynecology

## 2016-04-29 ENCOUNTER — Encounter: Payer: Self-pay | Admitting: Obstetrics & Gynecology

## 2016-04-29 VITALS — BP 118/68 | HR 72 | Resp 14 | Ht 66.0 in | Wt 136.8 lb

## 2016-04-29 DIAGNOSIS — N912 Amenorrhea, unspecified: Secondary | ICD-10-CM | POA: Diagnosis not present

## 2016-04-29 LAB — POCT URINE PREGNANCY: Preg Test, Ur: POSITIVE — AB

## 2016-04-29 NOTE — Progress Notes (Signed)
GYNECOLOGY  VISIT   HPI: 26 y.o. 361P0000 Married Caucasian female with h/o 3 positive home UPTs and amenorrhea.  Pt's LMP was 03/26/16 right after her IUD was removed.  EDC based on LMP would be 01/01/17.  She thinks her cycle was a little early.  She does not have any breast tenderness or fatigue at this time.  Denies nausea.  Her husband accompanies her today.  He has lots of questions.  Pt is taking PNV.  She drank some alcohol a week or so ago so has questions about this. Due to timing in pregnancy, likliehood of issues is extremely low.    D/W pt vaccinations, testing.  Rubella titer immune 03/25/16.  She did have a flu shot.  Tdap was last 10/16/08.  She will need this updated in pregnancy.  She did have chicken pox as a child.  They do not have cats in the homes.  She was advised about changing kitty litter.    Fish/shellfish/mercury discuss.  Patient does eat some fish.  Unpasteurized cheese/juices discussed.  Nitrites in foods disucssed.  Exercise and intercourse discussed.  Fetal DNA particle testing discussed.  First trimester down's testing discussed.  Husband has a cousin with a child having Down's.  Also, there is a significant hx of type 2 diabetes.  They have questions about this in relation to pregnancy and the baby.    Past Medical History:  Diagnosis Date  . Menorrhagia     Past Surgical History:  Procedure Laterality Date  . TONSILLECTOMY      MEDS:  Reviewed in EPIC and UTD  ALLERGIES: Patient has no known allergies.  Family History  Problem Relation Age of Onset  . Anxiety disorder Mother   . Anxiety disorder Paternal Uncle   . Multiple births Maternal Grandmother   . Cancer Paternal Grandfather   . Anxiety disorder Paternal Grandfather     SH:  Married, non smoker  Review of Systems  All other systems reviewed and are negative.   PHYSICAL EXAMINATION:    BP 118/68 (BP Location: Right Arm, Patient Position: Sitting, Cuff Size: Normal)   Pulse 72    Resp 14   Ht 5\' 6"  (1.676 m)   Wt 136 lb 12.8 oz (62.1 kg)   LMP 03/25/2016   BMI 22.08 kg/m     Physical Exam  Constitutional: She is oriented to person, place, and time. She appears well-developed and well-nourished.  Neurological: She is alert and oriented to person, place, and time.  Psychiatric: She has a normal mood and affect.    Assessment: Amenorrhea with +pregnancy test here today  Plan: Pt will return in about 3 week for viability scan. Continue PNV   ~30 minutes spent with patient and spouse and all of this time was in face to face discussion of above.

## 2016-04-30 ENCOUNTER — Other Ambulatory Visit: Payer: Self-pay | Admitting: *Deleted

## 2016-04-30 DIAGNOSIS — N912 Amenorrhea, unspecified: Secondary | ICD-10-CM

## 2016-04-30 DIAGNOSIS — Z3201 Encounter for pregnancy test, result positive: Secondary | ICD-10-CM

## 2016-05-22 ENCOUNTER — Ambulatory Visit (INDEPENDENT_AMBULATORY_CARE_PROVIDER_SITE_OTHER): Payer: BLUE CROSS/BLUE SHIELD

## 2016-05-22 ENCOUNTER — Ambulatory Visit (INDEPENDENT_AMBULATORY_CARE_PROVIDER_SITE_OTHER): Payer: BLUE CROSS/BLUE SHIELD | Admitting: Obstetrics & Gynecology

## 2016-05-22 VITALS — BP 90/60 | HR 72 | Resp 14 | Ht 66.0 in | Wt 136.0 lb

## 2016-05-22 DIAGNOSIS — N912 Amenorrhea, unspecified: Secondary | ICD-10-CM | POA: Diagnosis not present

## 2016-05-22 DIAGNOSIS — Z3201 Encounter for pregnancy test, result positive: Secondary | ICD-10-CM | POA: Diagnosis not present

## 2016-05-22 DIAGNOSIS — O3680X1 Pregnancy with inconclusive fetal viability, fetus 1: Secondary | ICD-10-CM

## 2016-05-22 NOTE — Progress Notes (Signed)
26 y.o. 191P0000 Married Caucasian female here for pelvic ultrasound to assess viability of pregnancy.  She is having some nausea and fatigue.  Has not had emesis.  No pelvic pain.  Denies vaginal bleeding.  Patient's last menstrual period was 03/25/2016.  EGA 8 1/7 weeks.  EDC 10/171/8.    Findings:  UTERUS:  Mason JimSingleton IUD with CRL 1.32cm noted.  EGA based on ultrasound findings is 7 4/7 weeks.  LMP is BOE.  Normal FCA noted at 153 BPM. ADNEXA: Left ovary: 3.4 x 1.8cm       Right ovary: 5.1 x 2.3 with corpus luteal cyst noted CUL DE SAC: no free fluid  Discussion:  Findings reviewed.  Pt and spouse with many questions.  These were all addressed individually.  This couple will benefit from prenatal classes and advised information will be provided [facility].  Appropriateness of transfer of care reviewed.  They will choose and let me know for transfer of records.  Wished well.  Assessment:  Singleton IUP at 8 1/7 weeks by LMP, confirmed by first trimester PUS today  Plan:  Pt will transfer care at this point and notify my office where records should be sent. Taking PNV  ~30 minutes spent with patient >50% of time was in face to face discussion of above.

## 2016-05-28 ENCOUNTER — Encounter: Payer: Self-pay | Admitting: Obstetrics & Gynecology

## 2016-05-29 DIAGNOSIS — O26859 Spotting complicating pregnancy, unspecified trimester: Secondary | ICD-10-CM | POA: Diagnosis not present

## 2016-05-29 DIAGNOSIS — Z3A01 Less than 8 weeks gestation of pregnancy: Secondary | ICD-10-CM | POA: Diagnosis not present

## 2016-06-02 DIAGNOSIS — Z3401 Encounter for supervision of normal first pregnancy, first trimester: Secondary | ICD-10-CM | POA: Diagnosis not present

## 2016-06-02 DIAGNOSIS — Z3491 Encounter for supervision of normal pregnancy, unspecified, first trimester: Secondary | ICD-10-CM | POA: Diagnosis not present

## 2016-06-02 DIAGNOSIS — Z36 Encounter for antenatal screening for chromosomal anomalies: Secondary | ICD-10-CM | POA: Diagnosis not present

## 2016-06-02 DIAGNOSIS — Z3689 Encounter for other specified antenatal screening: Secondary | ICD-10-CM | POA: Diagnosis not present

## 2016-06-02 LAB — OB RESULTS CONSOLE RPR: RPR: NONREACTIVE

## 2016-06-02 LAB — OB RESULTS CONSOLE HIV ANTIBODY (ROUTINE TESTING): HIV: NONREACTIVE

## 2016-06-02 LAB — OB RESULTS CONSOLE HEPATITIS B SURFACE ANTIGEN: HEP B S AG: NEGATIVE

## 2016-06-02 LAB — OB RESULTS CONSOLE GC/CHLAMYDIA
Chlamydia: NEGATIVE
GC PROBE AMP, GENITAL: NEGATIVE

## 2016-06-02 LAB — OB RESULTS CONSOLE RUBELLA ANTIBODY, IGM: Rubella: IMMUNE

## 2016-06-02 LAB — OB RESULTS CONSOLE ANTIBODY SCREEN: Antibody Screen: NEGATIVE

## 2016-06-02 LAB — OB RESULTS CONSOLE ABO/RH: RH TYPE: POSITIVE

## 2016-06-19 DIAGNOSIS — Z3401 Encounter for supervision of normal first pregnancy, first trimester: Secondary | ICD-10-CM | POA: Diagnosis not present

## 2016-06-19 DIAGNOSIS — Z3689 Encounter for other specified antenatal screening: Secondary | ICD-10-CM | POA: Diagnosis not present

## 2016-06-19 DIAGNOSIS — Z3682 Encounter for antenatal screening for nuchal translucency: Secondary | ICD-10-CM | POA: Diagnosis not present

## 2016-07-16 DIAGNOSIS — Z3402 Encounter for supervision of normal first pregnancy, second trimester: Secondary | ICD-10-CM | POA: Diagnosis not present

## 2016-07-16 DIAGNOSIS — Z361 Encounter for antenatal screening for raised alphafetoprotein level: Secondary | ICD-10-CM | POA: Diagnosis not present

## 2016-08-04 DIAGNOSIS — Z3402 Encounter for supervision of normal first pregnancy, second trimester: Secondary | ICD-10-CM | POA: Diagnosis not present

## 2016-08-04 DIAGNOSIS — Z363 Encounter for antenatal screening for malformations: Secondary | ICD-10-CM | POA: Diagnosis not present

## 2016-09-03 DIAGNOSIS — Z3402 Encounter for supervision of normal first pregnancy, second trimester: Secondary | ICD-10-CM | POA: Diagnosis not present

## 2016-10-01 DIAGNOSIS — Z3402 Encounter for supervision of normal first pregnancy, second trimester: Secondary | ICD-10-CM | POA: Diagnosis not present

## 2016-10-01 DIAGNOSIS — Z3689 Encounter for other specified antenatal screening: Secondary | ICD-10-CM | POA: Diagnosis not present

## 2016-10-13 ENCOUNTER — Inpatient Hospital Stay (HOSPITAL_COMMUNITY): Admission: AD | Admit: 2016-10-13 | Payer: Self-pay | Source: Ambulatory Visit | Admitting: Obstetrics and Gynecology

## 2016-10-15 DIAGNOSIS — Z3403 Encounter for supervision of normal first pregnancy, third trimester: Secondary | ICD-10-CM | POA: Diagnosis not present

## 2016-10-15 DIAGNOSIS — Z3689 Encounter for other specified antenatal screening: Secondary | ICD-10-CM | POA: Diagnosis not present

## 2016-11-03 DIAGNOSIS — Z23 Encounter for immunization: Secondary | ICD-10-CM | POA: Diagnosis not present

## 2016-11-03 DIAGNOSIS — Z3403 Encounter for supervision of normal first pregnancy, third trimester: Secondary | ICD-10-CM | POA: Diagnosis not present

## 2016-11-18 DIAGNOSIS — Z3403 Encounter for supervision of normal first pregnancy, third trimester: Secondary | ICD-10-CM | POA: Diagnosis not present

## 2016-11-26 DIAGNOSIS — Z3403 Encounter for supervision of normal first pregnancy, third trimester: Secondary | ICD-10-CM | POA: Diagnosis not present

## 2016-11-26 DIAGNOSIS — Z3685 Encounter for antenatal screening for Streptococcus B: Secondary | ICD-10-CM | POA: Diagnosis not present

## 2016-11-26 LAB — OB RESULTS CONSOLE GBS: GBS: POSITIVE

## 2016-12-12 DIAGNOSIS — O365930 Maternal care for other known or suspected poor fetal growth, third trimester, not applicable or unspecified: Secondary | ICD-10-CM | POA: Diagnosis not present

## 2016-12-12 DIAGNOSIS — Z3A37 37 weeks gestation of pregnancy: Secondary | ICD-10-CM | POA: Diagnosis not present

## 2016-12-18 DIAGNOSIS — O365930 Maternal care for other known or suspected poor fetal growth, third trimester, not applicable or unspecified: Secondary | ICD-10-CM | POA: Diagnosis not present

## 2016-12-18 DIAGNOSIS — Z3A38 38 weeks gestation of pregnancy: Secondary | ICD-10-CM | POA: Diagnosis not present

## 2016-12-18 DIAGNOSIS — Z23 Encounter for immunization: Secondary | ICD-10-CM | POA: Diagnosis not present

## 2016-12-22 ENCOUNTER — Encounter (HOSPITAL_COMMUNITY): Payer: Self-pay | Admitting: *Deleted

## 2016-12-22 ENCOUNTER — Telehealth (HOSPITAL_COMMUNITY): Payer: Self-pay | Admitting: *Deleted

## 2016-12-22 NOTE — Telephone Encounter (Signed)
Preadmission screen  

## 2016-12-23 ENCOUNTER — Other Ambulatory Visit: Payer: Self-pay | Admitting: Obstetrics and Gynecology

## 2016-12-24 ENCOUNTER — Inpatient Hospital Stay (HOSPITAL_COMMUNITY): Payer: BLUE CROSS/BLUE SHIELD | Admitting: Anesthesiology

## 2016-12-24 ENCOUNTER — Encounter (HOSPITAL_COMMUNITY): Payer: Self-pay

## 2016-12-24 ENCOUNTER — Encounter (HOSPITAL_COMMUNITY): Payer: Self-pay | Admitting: Anesthesiology

## 2016-12-24 ENCOUNTER — Inpatient Hospital Stay (HOSPITAL_COMMUNITY)
Admission: RE | Admit: 2016-12-24 | Discharge: 2016-12-26 | DRG: 807 | Disposition: A | Discharge status: Home / Self Care | Payer: BLUE CROSS/BLUE SHIELD | Source: Ambulatory Visit | Attending: Obstetrics and Gynecology | Admitting: Obstetrics and Gynecology

## 2016-12-24 DIAGNOSIS — Z3A39 39 weeks gestation of pregnancy: Secondary | ICD-10-CM | POA: Diagnosis not present

## 2016-12-24 DIAGNOSIS — O365990 Maternal care for other known or suspected poor fetal growth, unspecified trimester, not applicable or unspecified: Secondary | ICD-10-CM

## 2016-12-24 DIAGNOSIS — O365930 Maternal care for other known or suspected poor fetal growth, third trimester, not applicable or unspecified: Principal | ICD-10-CM | POA: Diagnosis present

## 2016-12-24 DIAGNOSIS — O99824 Streptococcus B carrier state complicating childbirth: Secondary | ICD-10-CM | POA: Diagnosis present

## 2016-12-24 DIAGNOSIS — Z3490 Encounter for supervision of normal pregnancy, unspecified, unspecified trimester: Secondary | ICD-10-CM | POA: Diagnosis present

## 2016-12-24 LAB — ABO/RH: ABO/RH(D): O POS

## 2016-12-24 LAB — CBC
HEMATOCRIT: 35.3 % — AB (ref 36.0–46.0)
HEMOGLOBIN: 12.3 g/dL (ref 12.0–15.0)
MCH: 31.4 pg (ref 26.0–34.0)
MCHC: 34.8 g/dL (ref 30.0–36.0)
MCV: 90.1 fL (ref 78.0–100.0)
Platelets: 173 10*3/uL (ref 150–400)
RBC: 3.92 MIL/uL (ref 3.87–5.11)
RDW: 12.9 % (ref 11.5–15.5)
WBC: 12.1 10*3/uL — ABNORMAL HIGH (ref 4.0–10.5)

## 2016-12-24 LAB — TYPE AND SCREEN
ABO/RH(D): O POS
Antibody Screen: NEGATIVE

## 2016-12-24 LAB — RPR: RPR Ser Ql: NONREACTIVE

## 2016-12-24 MED ORDER — WITCH HAZEL-GLYCERIN EX PADS: 1.0000 "application " | MEDICATED_PAD | CUTANEOUS | Status: DC | PRN

## 2016-12-24 MED ORDER — PRENATAL MULTIVITAMIN CH
1.0000 | ORAL_TABLET | Freq: Every day | ORAL | Status: DC
  Administered 2016-12-25: 1 via ORAL
  Filled 2016-12-24: qty 1

## 2016-12-24 MED ORDER — PENICILLIN G POTASSIUM 5000000 UNITS IJ SOLR
5.0000 10*6.[IU] | Freq: Once | INTRAVENOUS | Status: AC
  Administered 2016-12-24: 5 10*6.[IU] via INTRAVENOUS
  Filled 2016-12-24: qty 5

## 2016-12-24 MED ORDER — TERBUTALINE SULFATE 1 MG/ML IJ SOLN
0.2500 mg | Freq: Once | INTRAMUSCULAR | Status: DC | PRN
  Filled 2016-12-24: qty 1

## 2016-12-24 MED ORDER — ZOLPIDEM TARTRATE 5 MG PO TABS: 5.0000 mg | ORAL_TABLET | Freq: Every evening | ORAL | Status: DC | PRN

## 2016-12-24 MED ORDER — METHYLERGONOVINE MALEATE 0.2 MG/ML IJ SOLN: 0.2000 mg | INTRAMUSCULAR | Status: DC | PRN

## 2016-12-24 MED ORDER — EPHEDRINE 5 MG/ML INJ
10.0000 mg | INTRAVENOUS | Status: DC | PRN
  Filled 2016-12-24: qty 2

## 2016-12-24 MED ORDER — DIPHENHYDRAMINE HCL 50 MG/ML IJ SOLN: 12.5000 mg | INTRAMUSCULAR | Status: DC | PRN

## 2016-12-24 MED ORDER — FENTANYL 2.5 MCG/ML BUPIVACAINE 1/10 % EPIDURAL INFUSION (WH - ANES)
14.0000 mL/h | INTRAMUSCULAR | Status: DC | PRN
  Administered 2016-12-24: 14 mL/h via EPIDURAL
  Filled 2016-12-24: qty 100

## 2016-12-24 MED ORDER — ONDANSETRON HCL 4 MG/2ML IJ SOLN: 4.0000 mg | Freq: Four times a day (QID) | INTRAMUSCULAR | Status: DC | PRN

## 2016-12-24 MED ORDER — LIDOCAINE HCL (PF) 1 % IJ SOLN
INTRAMUSCULAR | Status: DC | PRN
  Administered 2016-12-24 (×2): 5 mL via EPIDURAL

## 2016-12-24 MED ORDER — OXYTOCIN 40 UNITS IN LACTATED RINGERS INFUSION - SIMPLE MED
1.0000 m[IU]/min | INTRAVENOUS | Status: DC
  Administered 2016-12-24: 2 m[IU]/min via INTRAVENOUS
  Filled 2016-12-24: qty 1000

## 2016-12-24 MED ORDER — LIDOCAINE HCL (PF) 1 % IJ SOLN
INTRAMUSCULAR | Status: AC
  Filled 2016-12-24: qty 30

## 2016-12-24 MED ORDER — ACETAMINOPHEN 325 MG PO TABS
650.0000 mg | ORAL_TABLET | ORAL | Status: DC | PRN
  Administered 2016-12-25 (×2): 650 mg via ORAL
  Filled 2016-12-24 (×2): qty 2

## 2016-12-24 MED ORDER — MISOPROSTOL 25 MCG QUARTER TABLET
25.0000 ug | ORAL_TABLET | ORAL | Status: AC | PRN
  Administered 2016-12-24 (×2): 25 ug via VAGINAL
  Filled 2016-12-24 (×2): qty 1

## 2016-12-24 MED ORDER — LACTATED RINGERS IV SOLN
INTRAVENOUS | Status: DC
  Administered 2016-12-24 (×2): via INTRAVENOUS

## 2016-12-24 MED ORDER — PENICILLIN G POT IN DEXTROSE 60000 UNIT/ML IV SOLN
3.0000 10*6.[IU] | INTRAVENOUS | Status: DC
  Administered 2016-12-24: 3 10*6.[IU] via INTRAVENOUS
  Filled 2016-12-24 (×6): qty 50

## 2016-12-24 MED ORDER — OXYCODONE-ACETAMINOPHEN 5-325 MG PO TABS: 1.0000 | ORAL_TABLET | ORAL | Status: DC | PRN

## 2016-12-24 MED ORDER — COCONUT OIL OIL: 1.0000 "application " | TOPICAL_OIL | Status: DC | PRN

## 2016-12-24 MED ORDER — DIPHENHYDRAMINE HCL 25 MG PO CAPS: 25.0000 mg | ORAL_CAPSULE | Freq: Four times a day (QID) | ORAL | Status: DC | PRN

## 2016-12-24 MED ORDER — METHYLERGONOVINE MALEATE 0.2 MG PO TABS: 0.2000 mg | ORAL_TABLET | ORAL | Status: DC | PRN

## 2016-12-24 MED ORDER — ACETAMINOPHEN 325 MG PO TABS: 650.0000 mg | ORAL_TABLET | ORAL | Status: DC | PRN

## 2016-12-24 MED ORDER — ONDANSETRON HCL 4 MG/2ML IJ SOLN: 4.0000 mg | INTRAMUSCULAR | Status: DC | PRN

## 2016-12-24 MED ORDER — SOD CITRATE-CITRIC ACID 500-334 MG/5ML PO SOLN: 30.0000 mL | ORAL | Status: DC | PRN

## 2016-12-24 MED ORDER — ONDANSETRON HCL 4 MG PO TABS: 4.0000 mg | ORAL_TABLET | ORAL | Status: DC | PRN

## 2016-12-24 MED ORDER — PHENYLEPHRINE 40 MCG/ML (10ML) SYRINGE FOR IV PUSH (FOR BLOOD PRESSURE SUPPORT)
80.0000 ug | PREFILLED_SYRINGE | INTRAVENOUS | Status: DC | PRN
  Filled 2016-12-24: qty 10
  Filled 2016-12-24: qty 5

## 2016-12-24 MED ORDER — LACTATED RINGERS IV SOLN
500.0000 mL | Freq: Once | INTRAVENOUS | Status: AC
  Administered 2016-12-24: 500 mL via INTRAVENOUS

## 2016-12-24 MED ORDER — SENNOSIDES-DOCUSATE SODIUM 8.6-50 MG PO TABS
2.0000 | ORAL_TABLET | ORAL | Status: DC
  Administered 2016-12-25 (×2): 2 via ORAL
  Filled 2016-12-24 (×2): qty 2

## 2016-12-24 MED ORDER — DIBUCAINE 1 % RE OINT
1.0000 | TOPICAL_OINTMENT | RECTAL | Status: DC | PRN
Start: 2016-12-24 — End: 2016-12-26

## 2016-12-24 MED ORDER — BENZOCAINE-MENTHOL 20-0.5 % EX AERO
1.0000 "application " | INHALATION_SPRAY | CUTANEOUS | Status: DC | PRN
  Administered 2016-12-24: 1 via TOPICAL
  Filled 2016-12-24: qty 56

## 2016-12-24 MED ORDER — LACTATED RINGERS IV SOLN: 500.0000 mL | INTRAVENOUS | Status: DC | PRN

## 2016-12-24 MED ORDER — IBUPROFEN 600 MG PO TABS
600.0000 mg | ORAL_TABLET | Freq: Four times a day (QID) | ORAL | Status: DC
  Administered 2016-12-24 – 2016-12-26 (×7): 600 mg via ORAL
  Filled 2016-12-24 (×7): qty 1

## 2016-12-24 MED ORDER — TETANUS-DIPHTH-ACELL PERTUSSIS 5-2.5-18.5 LF-MCG/0.5 IM SUSP: 0.5000 mL | Freq: Once | INTRAMUSCULAR | Status: DC

## 2016-12-24 MED ORDER — PHENYLEPHRINE 40 MCG/ML (10ML) SYRINGE FOR IV PUSH (FOR BLOOD PRESSURE SUPPORT)
80.0000 ug | PREFILLED_SYRINGE | INTRAVENOUS | Status: DC | PRN
  Filled 2016-12-24: qty 5

## 2016-12-24 MED ORDER — SIMETHICONE 80 MG PO CHEW: 80.0000 mg | CHEWABLE_TABLET | ORAL | Status: DC | PRN

## 2016-12-24 MED ORDER — OXYCODONE-ACETAMINOPHEN 5-325 MG PO TABS: 2.0000 | ORAL_TABLET | ORAL | Status: DC | PRN

## 2016-12-24 MED ORDER — LACTATED RINGERS IV SOLN: 500.0000 mL | Freq: Once | INTRAVENOUS | Status: DC

## 2016-12-24 NOTE — Anesthesia Pain Management Evaluation Note (Signed)
  CRNA Pain Management Visit Note  Patient: Maureen Carlson, 26 y.o., female  "Hello I am a member of the anesthesia team at [facility]. We have an anesthesia team available at all times to provide care throughout the hospital, including epidural management and anesthesia for C-section. I don't know your plan for the delivery whether it a natural birth, water birth, IV sedation, nitrous supplementation, doula or epidural, but we want to meet your pain goals."   1.Was your pain managed to your expectations on prior hospitalizations?   No prior hospitalizations  2.What is your expectation for pain management during this hospitalization?     Epidural  3.How can we help you reach that goal? Maintain epidural  Record the patient's initial score and the patient's pain goal.   Pain: 0 - ( 7 prior to insertion of epidural)   Pain Goal: 2 [facility] wants you to be able to say your pain was always managed very well.  Hulbert Branscome 12/24/2016

## 2016-12-24 NOTE — Anesthesia Procedure Notes (Signed)
Epidural Patient location during procedure: OB Start time: 12/24/2016 8:40 AM End time: 12/24/2016 8:50 AM  Preanesthetic Checklist Completed: patient identified, site marked, surgical consent, pre-op evaluation, timeout performed, IV checked, risks and benefits discussed and monitors and equipment checked  Epidural Patient position: sitting Prep: site prepped and draped and DuraPrep Patient monitoring: continuous pulse ox and blood pressure Approach: midline Location: L4-L5 Injection technique: LOR air  Needle:  Needle type: Tuohy  Needle gauge: 17 G Needle length: 9 cm and 9 Needle insertion depth: 6 cm Catheter type: closed end flexible Catheter size: 19 Gauge Catheter at skin depth: 11 cm Test dose: negative  Assessment Events: blood not aspirated, injection not painful, no injection resistance, negative IV test and no paresthesia

## 2016-12-24 NOTE — H&P (Signed)
Maureen Carlson is a 26 y.o. female presenting for IOL for IUGR. OB History    Gravida Para Term Preterm AB Living   1 0 0 0 0 0   SAB TAB Ectopic Multiple Live Births   0 0 0 0       Past Medical History:  Diagnosis Date  . Headache   . Menorrhagia    Past Surgical History:  Procedure Laterality Date  . ARTHROSCOPIC REPAIR ACL    . TONSILLECTOMY    . TONSILLECTOMY     Family History: family history includes Anemia in her mother; Anxiety disorder in her mother, paternal grandfather, and paternal uncle; Asthma in her brother; Cancer in her paternal grandfather; Heart attack in her mother; Hypertension in her father; Multiple births in her maternal grandmother; Pyelonephritis in her mother; Thyroid disease in her maternal grandmother and mother. Social History:  reports that she has never smoked. She has never used smokeless tobacco. She reports that she drinks about 0.6 - 1.2 oz of alcohol per week . She reports that she does not use drugs.     Maternal Diabetes: No Genetic Screening: Normal Maternal Ultrasounds/Referrals: Normal Fetal Ultrasounds or other Referrals:  None Maternal Substance Abuse:  No Significant Maternal Medications:  None Significant Maternal Lab Results:  None Other Comments:  None  Review of Systems  Constitutional: Negative.   All other systems reviewed and are negative.  Maternal Medical History:  Contractions: Onset was less than 1 hour ago.   Perceived severity is mild.    Fetal activity: Perceived fetal activity is normal.   Last perceived fetal movement was within the past hour.    Prenatal complications: IUGR.   Prenatal Complications - Diabetes: none.    Dilation: 1.5 Effacement (%): 60, 70 Station: -3 Exam by:: amwalker,rn Blood pressure 108/69, pulse 66, temperature 98.3 F (36.8 C), temperature source Oral, resp. rate 20, height  (1.676 m), weight 74.8 kg (165 lb), last menstrual period 03/25/2016. Maternal Exam:  Uterine  Assessment: Contraction strength is mild.  Contraction frequency is rare.   Abdomen: Patient reports no abdominal tenderness. Fetal presentation: vertex  Introitus: Normal vulva. Normal vagina.  Ferning test: not done.  Nitrazine test: not done. Amniotic fluid character: not assessed.  Pelvis: adequate for delivery.   Cervix: Cervix evaluated by digital exam.     Physical Exam  Nursing note and vitals reviewed. Constitutional: She is oriented to person, place, and time. She appears well-developed and well-nourished.  HENT:  Head: Normocephalic and atraumatic.  Neck: Normal range of motion. Neck supple.  Cardiovascular: Normal rate and regular rhythm.   Respiratory: Effort normal and breath sounds normal.  GI: Soft. Bowel sounds are normal.  Genitourinary: Vagina normal and uterus normal.  Musculoskeletal: Normal range of motion.  Neurological: She is alert and oriented to person, place, and time. She has normal reflexes.  Skin: Skin is warm and dry.  Psychiatric: She has a normal mood and affect.    Prenatal labs: ABO, Rh: --/--/O POS, O POS (10/10 0135) Antibody: NEG (10/10 0135) Rubella: @ RPR: Nonreactive (03/19 0000)  HBsAg: Negative (03/19 0000)  HIV: Non-reactive (03/19 0000)  GBS: Positive (09/12 0000)   Assessment/Plan: IUGR IOL   Maureen Carlson J 12/24/2016, 6:16 AM

## 2016-12-24 NOTE — Lactation Note (Signed)
This note was copied from a baby's chart. Lactation Consultation Note  Patient Name: Maureen Carlson Date: 12/24/2016 Reason for consult: Initial assessment Baby at 5 hr of life. Mom reports baby latched well after birth but has been asleep since. Mom has soft breast with everted nipples. Baby did stir when he was unwrapped. He has a nice gape and latched easily. He was sleepy at the breast after 5 minutes of sucking baby came off the breast and spit up clear bubbles. Discussed baby behavior, feeding frequency, baby belly size, voids, wt loss, breast changes, and nipple care. Demonstrated manual expression, colostrum noted bilaterally, spoon feeding taught. Given lactation handouts. Aware of OP services and support group. Mom will offer the breast on demand q3hr, post express, and spoon feed expressed milk per volume guidelines.     Maternal Data Has patient been taught Hand Expression?: Yes Does the patient have breastfeeding experience prior to this delivery?: No  Feeding Feeding Type: Breast Fed Length of feed: 5 min  LATCH Score Latch: Grasps breast easily, tongue down, lips flanged, rhythmical sucking.  Audible Swallowing: None  Type of Nipple: Everted at rest and after stimulation  Comfort (Breast/Nipple): Soft / non-tender  Hold (Positioning): Assistance needed to correctly position infant at breast and maintain latch.  LATCH Score: 7  Interventions Interventions: Breast feeding basics reviewed;Assisted with latch;Skin to skin;Breast massage;Hand express;Support pillows;Position options;DEBP;Adjust position  Lactation Tools Discussed/Used WIC Program: No   Consult Status Consult Status: Follow-up Date: 12/25/16 Follow-up type: In-patient    Maureen Carlson 12/24/2016, 6:29 PM

## 2016-12-24 NOTE — Anesthesia Postprocedure Evaluation (Signed)
Anesthesia Post Note  Patient: Maureen Carlson  Procedure(s) Performed: AN AD HOC LABOR EPIDURAL     Patient location during evaluation: Mother Baby Anesthesia Type: Epidural Level of consciousness: awake and alert Pain management: pain level controlled Vital Signs Assessment: post-procedure vital signs reviewed and stable Respiratory status: spontaneous breathing, nonlabored ventilation and respiratory function stable Cardiovascular status: stable Postop Assessment: no headache, no backache and epidural receding Anesthetic complications: no    Last Vitals:  Vitals:   12/24/16 1200 12/24/16 1301  BP: 122/78 (!) 144/80  Pulse: 93 (!) 101  Resp:    Temp:    SpO2:      Last Pain:  Vitals:   12/24/16 1140  TempSrc:   PainSc: 0-No pain                 Tyke Outman

## 2016-12-24 NOTE — Anesthesia Preprocedure Evaluation (Signed)

## 2016-12-25 LAB — CBC
HEMATOCRIT: 35.3 % — AB (ref 36.0–46.0)
HEMOGLOBIN: 12.3 g/dL (ref 12.0–15.0)
MCH: 31.3 pg (ref 26.0–34.0)
MCHC: 34.8 g/dL (ref 30.0–36.0)
MCV: 89.8 fL (ref 78.0–100.0)
Platelets: 163 10*3/uL (ref 150–400)
RBC: 3.93 MIL/uL (ref 3.87–5.11)
RDW: 13.1 % (ref 11.5–15.5)
WBC: 14 10*3/uL — ABNORMAL HIGH (ref 4.0–10.5)

## 2016-12-25 NOTE — Plan of Care (Signed)
Problem: Nutritional: Goal: Mothers verbalization of comfort with breastfeeding process will improve Baby is not eating; baby is only sucking a few times at the breast and falling asleep.  Baby is getting close to 24 hours old and still very sleepy. Encouraged mother to continue to hand express breast milk frequently and spoon feed as much as possible and attempt to breast feed every three hours due to baby's size. Encouraged skin to skin as much as possible as well. Provided collection bullets to mother and curved tip syringe. Explained use of curved tip syringe and finger feeding and encourage parents to call for assistance for first time finger/syringe feeding baby. Also provided mother with double electric breast pump. Explained and demonstrated use of DEBP and discussed how to clean parts and how long breast milk could be stored at room temperature.

## 2016-12-25 NOTE — Progress Notes (Signed)
PPD #1, SVD, IOL for IUGR, 2nd degree laceration, baby boy  S:  Reports feeling good, but tired             Tolerating po/ No nausea or vomiting / Denies dizziness or SOB             Bleeding is moderate             Pain controlled with Motrin             Up ad lib / ambulatory / voiding QS  Newborn breast feeding/pumping colostrum  / Circumcision - planning   O:               VS: BP 124/71 (BP Location: Left Arm)   Pulse 71   Temp 98 F (36.7 C) (Oral)   Resp 18   Ht  (1.676 m)   Wt 74.8 kg (165 lb)   LMP 03/25/2016   SpO2 100%   Breastfeeding? Unknown   BMI 26.63 kg/m    LABS:              Recent Labs  12/24/16 0135 12/25/16 0530  WBC 12.1* 14.0*  HGB 12.3 12.3  PLT 173 163               Blood type: --/--/O POS, O POS (10/10 0135)  Rubella: Rubella Immune                  I&O: Intake/Output      10/10 0701 - 10/11 0700 10/11 0701 - 10/12 0700   Urine (mL/kg/hr) 800 (0.4)    Blood 200    Total Output 1000     Net -1000                        Physical Exam:             Alert and oriented X3  Lungs: Clear and unlabored  Heart: regular rate and rhythm / no murmurs  Abdomen: soft, non-tender, non-distended              Fundus: firm, non-tender, U-1  Perineum: well approximated 2nd degree laceration - healing well, mild edema, no significant erythema or evidence of hematoma  Lochia: appropriate, no clots  Extremities: trace pedal edema, no calf pain or tenderness    A: PPD # 1, SVD, IOL for IUGR  2nd degree laceration    Doing well - stable status  P: Routine post partum orders  See lactation today, discussed breastfeeding tips   May shower today  Encouraged to rest when baby rests  Anticipate discharge home tomorrow  Carlean Jews, MSN, CNM [facility]

## 2016-12-25 NOTE — Lactation Note (Signed)
This note was copied from a baby's chart. Lactation Consultation Note  Patient Name: Maureen Carlson [id number] Date: 12/25/2016 Reason for consult: Follow-up assessment;Infant < 6lbs   Follow up with mom of 25 hour old infant. Infant with 4 BF attempts, colostrum gtts via spoon/finger, 5 voids and 4 stools since birth. Mom reports infant fed for 1.5 hours at the 11:30 feeding. LATCH scores 7-8. Infant weight 5 lb 11.6 oz with 1% weight loss since birth. Infant currently asleep in GM arms.   Mom is pumping and did obtain 2-3 cc colostrum with pumping. Enc mom to offer infant all EBM via spoon or curved tip syringe after BF. Discussed EBM can be given before feeding if infant sleepy and not willing to feed. Reviewed making sure infant awake and active with feeding and to use awakening techniques with feedings as needed to keep infant active at the breast.   We reviewed the importance of calories in a small baby and feeding infant at least every 3 hours and with feeding cues. Enc mom to pump and then hand express post pumping. Feeding plan written on board for parents to follow. Parents asked if they should awaken infant during the night and it was reiterated that yes they should if infant not cueing.   Parents with a lot of good questions that were answered. Enc parents to limit stimulation to infant between feedings to conserve calories. Parents without further questions/concerns at this time. Enc parents to call out for feeding assistance as needed.    Maternal Data Formula Feeding for Exclusion: No Has patient been taught Hand Expression?: Yes Does the patient have breastfeeding experience prior to this delivery?: No  Feeding Feeding Type: Breast Fed  LATCH Score Latch: Repeated attempts needed to sustain latch, nipple held in mouth throughout feeding, stimulation needed to elicit sucking reflex.  Audible Swallowing: A few with stimulation  Type of Nipple: Everted at rest and after  stimulation  Comfort (Breast/Nipple): Soft / non-tender  Hold (Positioning): No assistance needed to correctly position infant at breast.  LATCH Score: 8  Interventions    Lactation Tools Discussed/Used Pump Review: Setup, frequency, and cleaning;Milk Storage Initiated by:: Reviewed and encouraged every 3 hours after BF   Consult Status Consult Status: Follow-up Date: 12/26/16 Follow-up type: In-patient    Silas Flood Nishan Ovens 12/25/2016, 2:33 PM

## 2016-12-25 NOTE — Anesthesia Postprocedure Evaluation (Signed)
Anesthesia Post Note  Patient: Maureen Carlson  Procedure(s) Performed: AN AD HOC EPIDURAL     Patient location during evaluation: Mother Baby Anesthesia Type: Epidural Level of consciousness: awake, awake and alert, oriented and patient cooperative Pain management: pain level controlled Vital Signs Assessment: post-procedure vital signs reviewed and stable Respiratory status: spontaneous breathing, nonlabored ventilation and respiratory function stable Cardiovascular status: stable Postop Assessment: no headache, no backache, no apparent nausea or vomiting and patient able to bend at knees Anesthetic complications: no    Last Vitals:  Vitals:   12/24/16 2030 12/25/16 0448  BP: 128/80 124/71  Pulse: 69 71  Resp: 18 18  Temp: 36.7 C 36.7 C  SpO2: 99% 100%    Last Pain:  Vitals:   12/25/16 0900  TempSrc:   PainSc: 0-No pain   Pain Goal:                 Jacqueline Delapena L

## 2016-12-26 LAB — BIRTH TISSUE RECOVERY COLLECTION (PLACENTA DONATION)

## 2016-12-26 MED ORDER — IBUPROFEN 600 MG PO TABS: 600.0000 mg | ORAL_TABLET | Freq: Four times a day (QID) | ORAL | 0 refills | Status: DC

## 2016-12-26 NOTE — Progress Notes (Signed)
No c/o; nursing; circumcision today; voiding w/o difficulty; mild cramping but pain controlled Normal lochia  Temp:  [98.1 F (36.7 C)] 98.1 F (36.7 C) (10/12 0545) Pulse Rate:  [69] 69 (10/12 0545) Resp:  [18] 18 (10/12 0545) BP: (114)/(72) 114/72 (10/12 0545)  A&ox3 nml respirations Abd: soft, nt; fundus firm and below umb 2cm LE: no edema, nt bilat  CBC Latest Ref Rng & Units 12/25/2016 12/24/2016  WBC 4.0 - 10.5 K/uL 14.0(H) 12.1(H)  Hemoglobin 12.0 - 15.0 g/dL 16.1 09.6  Hematocrit 04.5 - 46.0 % 35.3(L) 35.3(L)  Platelets 150 - 400 K/uL 163 173   A/p: ppd 2 s/p svd 1. Doing well, d/c home and f/u in 6 wks 2. Nursing 3. Rh pos 4. Rubella immune

## 2016-12-26 NOTE — Discharge Summary (Signed)
Obstetric Discharge Summary Reason for Admission: induction of labor Prenatal Procedures: none Intrapartum Procedures: spontaneous vaginal delivery Postpartum Procedures: none Complications-Operative and Postpartum: second degree perineal laceration Hemoglobin  Date Value Ref Range Status  12/25/2016 12.3 12.0 - 15.0 g/dL Final   HCT  Date Value Ref Range Status  12/25/2016 35.3 (L) 36.0 - 46.0 % Final    Physical Exam:  General: alert and cooperative Lochia: appropriate Uterine Fundus: firm DVT Evaluation: No evidence of DVT seen on physical exam.  Discharge Diagnoses: Term Pregnancy-delivered  Discharge Information: Date: 12/26/2016 Activity: pelvic rest Diet: routine Medications: Ibuprofen Condition: stable Instructions: refer to practice specific booklet Discharge to: home Follow-up Information    Maureen Mackie, MD Follow up.   Specialty:  Obstetrics and Gynecology Contact information: [address] [place] [address] [phone]           Newborn Data: Live born female  Birth Weight: 5 lb 12.6 oz (2625 g) APGAR: 9, 9  Newborn Delivery   Birth date/time:  12/24/2016 13:12:00 Delivery type:  Vaginal, Spontaneous Delivery      Home with mother.  Vick Frees 12/26/2016, 11:15 AM

## 2016-12-26 NOTE — Lactation Note (Signed)
This note was copied from a baby's chart. Lactation Consultation Note; Baby in nursery for circ at this time. Mom reports he has fed a lot through the night. Reports he is latching well with occasional pain for only a few minutes. Reviewed wide open mouth and getting a deep latch. Suggested trying football hold- she has been using cross cradle. Reviewed normal behavior after circ. Suggested pumping if he is sleepy to promote milk supply. Reviewed engorgement prevention and treatment. Has Spectra pump for home. Reviewed our phone number, OP appointments and BFSG as resources for support after DC. No further questions at present. To call prn  Patient Name: Maureen Carlson [id number] Date: 12/26/2016 Reason for consult: Follow-up assessment   Maternal Data Formula Feeding for Exclusion: No Has patient been taught Hand Expression?: Yes Does the patient have breastfeeding experience prior to this delivery?: No  Feeding    LATCH Score                   Interventions Interventions: Breast feeding basics reviewed;Support pillows;Position options;DEBP  Lactation Tools Discussed/Used WIC Program: No   Consult Status Consult Status: Complete    Pamelia Hoit 12/26/2016, 8:12 AM

## 2017-02-02 DIAGNOSIS — Z30430 Encounter for insertion of intrauterine contraceptive device: Secondary | ICD-10-CM | POA: Diagnosis not present

## 2017-02-02 DIAGNOSIS — Z124 Encounter for screening for malignant neoplasm of cervix: Secondary | ICD-10-CM | POA: Diagnosis not present

## 2017-03-03 DIAGNOSIS — N939 Abnormal uterine and vaginal bleeding, unspecified: Secondary | ICD-10-CM | POA: Diagnosis not present

## 2017-06-19 ENCOUNTER — Ambulatory Visit: Payer: BLUE CROSS/BLUE SHIELD | Admitting: Obstetrics & Gynecology

## 2018-01-21 DIAGNOSIS — Z0000 Encounter for general adult medical examination without abnormal findings: Secondary | ICD-10-CM | POA: Diagnosis not present

## 2018-01-21 DIAGNOSIS — Z23 Encounter for immunization: Secondary | ICD-10-CM | POA: Diagnosis not present

## 2018-01-21 DIAGNOSIS — Z13220 Encounter for screening for lipoid disorders: Secondary | ICD-10-CM | POA: Diagnosis not present

## 2018-02-10 DIAGNOSIS — F411 Generalized anxiety disorder: Secondary | ICD-10-CM | POA: Diagnosis not present

## 2018-02-16 DIAGNOSIS — F411 Generalized anxiety disorder: Secondary | ICD-10-CM | POA: Diagnosis not present

## 2018-03-24 DIAGNOSIS — F411 Generalized anxiety disorder: Secondary | ICD-10-CM | POA: Diagnosis not present

## 2018-04-07 DIAGNOSIS — F411 Generalized anxiety disorder: Secondary | ICD-10-CM | POA: Diagnosis not present

## 2018-04-21 DIAGNOSIS — F411 Generalized anxiety disorder: Secondary | ICD-10-CM | POA: Diagnosis not present

## 2018-05-11 DIAGNOSIS — Z01419 Encounter for gynecological examination (general) (routine) without abnormal findings: Secondary | ICD-10-CM | POA: Diagnosis not present

## 2018-05-11 DIAGNOSIS — Z6824 Body mass index (BMI) 24.0-24.9, adult: Secondary | ICD-10-CM | POA: Diagnosis not present

## 2018-05-11 DIAGNOSIS — R1084 Generalized abdominal pain: Secondary | ICD-10-CM | POA: Diagnosis not present

## 2018-05-18 DIAGNOSIS — M9903 Segmental and somatic dysfunction of lumbar region: Secondary | ICD-10-CM | POA: Diagnosis not present

## 2018-05-18 DIAGNOSIS — M542 Cervicalgia: Secondary | ICD-10-CM | POA: Diagnosis not present

## 2018-05-18 DIAGNOSIS — M9901 Segmental and somatic dysfunction of cervical region: Secondary | ICD-10-CM | POA: Diagnosis not present

## 2018-05-18 DIAGNOSIS — M62830 Muscle spasm of back: Secondary | ICD-10-CM | POA: Diagnosis not present

## 2018-05-20 DIAGNOSIS — M9901 Segmental and somatic dysfunction of cervical region: Secondary | ICD-10-CM | POA: Diagnosis not present

## 2018-05-20 DIAGNOSIS — M9903 Segmental and somatic dysfunction of lumbar region: Secondary | ICD-10-CM | POA: Diagnosis not present

## 2018-05-20 DIAGNOSIS — M542 Cervicalgia: Secondary | ICD-10-CM | POA: Diagnosis not present

## 2018-05-20 DIAGNOSIS — M62830 Muscle spasm of back: Secondary | ICD-10-CM | POA: Diagnosis not present

## 2018-12-27 DIAGNOSIS — Z20828 Contact with and (suspected) exposure to other viral communicable diseases: Secondary | ICD-10-CM | POA: Diagnosis not present

## 2018-12-27 DIAGNOSIS — Z6824 Body mass index (BMI) 24.0-24.9, adult: Secondary | ICD-10-CM | POA: Diagnosis not present

## 2020-03-17 NOTE — L&D Delivery Note (Signed)
Delivery Note At 1:22 PM a viable and healthy female was delivered via Vaginal, Spontaneous (Presentation: ROA   ).  APGAR: 8, 9; weight  pending.   Placenta status: Spontaneous, Intact.  Cord: 3 vessels with the following complications: None.  Cord pH: na  Anesthesia: Epidural Episiotomy: None Lacerations:  none Suture Repair:  na Est. Blood Loss (mL):  200  Mom to postpartum.  Baby to Couplet care / Skin to Skin.  Shamiah Kahler J 10/14/2020, 1:32 PM

## 2020-04-17 DIAGNOSIS — Z419 Encounter for procedure for purposes other than remedying health state, unspecified: Secondary | ICD-10-CM | POA: Diagnosis not present

## 2020-05-09 LAB — OB RESULTS CONSOLE GC/CHLAMYDIA
Chlamydia: NEGATIVE
Gonorrhea: NEGATIVE

## 2020-05-15 DIAGNOSIS — Z419 Encounter for procedure for purposes other than remedying health state, unspecified: Secondary | ICD-10-CM | POA: Diagnosis not present

## 2020-06-15 DIAGNOSIS — Z419 Encounter for procedure for purposes other than remedying health state, unspecified: Secondary | ICD-10-CM | POA: Diagnosis not present

## 2020-07-15 DIAGNOSIS — Z419 Encounter for procedure for purposes other than remedying health state, unspecified: Secondary | ICD-10-CM | POA: Diagnosis not present

## 2020-08-15 ENCOUNTER — Encounter: Payer: Self-pay | Admitting: Registered"

## 2020-08-15 ENCOUNTER — Other Ambulatory Visit: Payer: Self-pay

## 2020-08-15 ENCOUNTER — Encounter: Payer: BC Managed Care – PPO | Attending: Obstetrics and Gynecology | Admitting: Registered"

## 2020-08-15 DIAGNOSIS — O24419 Gestational diabetes mellitus in pregnancy, unspecified control: Secondary | ICD-10-CM | POA: Insufficient documentation

## 2020-08-15 DIAGNOSIS — Z419 Encounter for procedure for purposes other than remedying health state, unspecified: Secondary | ICD-10-CM | POA: Diagnosis not present

## 2020-08-15 NOTE — Progress Notes (Signed)
Patient was seen on 08/15/20 for Gestational Diabetes self-management class at the [facility]. The following learning objectives were met by the patient during this course:   States the definition of Gestational Diabetes  States why dietary management is important in controlling blood glucose  Describes the effects each nutrient has on blood glucose levels  Demonstrates ability to create a balanced meal plan  Demonstrates carbohydrate counting   States when to check blood glucose levels  Demonstrates proper blood glucose monitoring techniques  States the effect of stress and exercise on blood glucose levels  States the importance of limiting caffeine and abstaining from alcohol and smoking  Blood glucose monitor given: Patient has meter and is checking blood sugar prior to class  Patient instructed to monitor glucose levels: FBS: 60 - <95; 1 hour: <140; 2 hour: <120  Patient received handouts:  Nutrition Diabetes and Pregnancy, including carb counting list  Patient will be seen for follow-up as needed.

## 2020-09-14 DIAGNOSIS — Z419 Encounter for procedure for purposes other than remedying health state, unspecified: Secondary | ICD-10-CM | POA: Diagnosis not present

## 2020-10-09 ENCOUNTER — Telehealth (HOSPITAL_COMMUNITY): Payer: Self-pay | Admitting: *Deleted

## 2020-10-09 ENCOUNTER — Encounter (HOSPITAL_COMMUNITY): Payer: Self-pay | Admitting: *Deleted

## 2020-10-09 NOTE — Telephone Encounter (Signed)
Preadmission screen  

## 2020-10-10 ENCOUNTER — Other Ambulatory Visit: Payer: Self-pay | Admitting: Obstetrics and Gynecology

## 2020-10-12 ENCOUNTER — Other Ambulatory Visit (HOSPITAL_COMMUNITY)
Admission: RE | Admit: 2020-10-12 | Discharge: 2020-10-12 | Disposition: A | Discharge status: Home / Self Care | Payer: BC Managed Care – PPO | Source: Ambulatory Visit | Attending: Obstetrics and Gynecology | Admitting: Obstetrics and Gynecology

## 2020-10-12 DIAGNOSIS — Z20822 Contact with and (suspected) exposure to covid-19: Secondary | ICD-10-CM | POA: Insufficient documentation

## 2020-10-12 DIAGNOSIS — Z01812 Encounter for preprocedural laboratory examination: Secondary | ICD-10-CM | POA: Insufficient documentation

## 2020-10-12 LAB — SARS CORONAVIRUS 2 (TAT 6-24 HRS): SARS Coronavirus 2: NEGATIVE

## 2020-10-13 ENCOUNTER — Other Ambulatory Visit: Payer: Self-pay

## 2020-10-14 ENCOUNTER — Inpatient Hospital Stay (HOSPITAL_COMMUNITY): Payer: BC Managed Care – PPO | Admitting: Anesthesiology

## 2020-10-14 ENCOUNTER — Inpatient Hospital Stay (HOSPITAL_COMMUNITY)
Admission: AD | Admit: 2020-10-14 | Discharge: 2020-10-15 | DRG: 807 | Disposition: A | Discharge status: Home / Self Care | Payer: BC Managed Care – PPO | Attending: Obstetrics and Gynecology | Admitting: Obstetrics and Gynecology

## 2020-10-14 ENCOUNTER — Inpatient Hospital Stay (HOSPITAL_COMMUNITY): Payer: BC Managed Care – PPO

## 2020-10-14 ENCOUNTER — Encounter (HOSPITAL_COMMUNITY): Payer: Self-pay | Admitting: Obstetrics and Gynecology

## 2020-10-14 DIAGNOSIS — O24410 Gestational diabetes mellitus in pregnancy, diet controlled: Secondary | ICD-10-CM

## 2020-10-14 DIAGNOSIS — Z3A39 39 weeks gestation of pregnancy: Secondary | ICD-10-CM

## 2020-10-14 DIAGNOSIS — O24420 Gestational diabetes mellitus in childbirth, diet controlled: Secondary | ICD-10-CM | POA: Diagnosis present

## 2020-10-14 DIAGNOSIS — Z419 Encounter for procedure for purposes other than remedying health state, unspecified: Secondary | ICD-10-CM | POA: Diagnosis not present

## 2020-10-14 DIAGNOSIS — Z3490 Encounter for supervision of normal pregnancy, unspecified, unspecified trimester: Secondary | ICD-10-CM | POA: Diagnosis present

## 2020-10-14 DIAGNOSIS — Z20822 Contact with and (suspected) exposure to covid-19: Secondary | ICD-10-CM | POA: Diagnosis present

## 2020-10-14 LAB — TYPE AND SCREEN
ABO/RH(D): O POS
Antibody Screen: NEGATIVE

## 2020-10-14 LAB — CBC
HCT: 33.6 % — ABNORMAL LOW (ref 36.0–46.0)
Hemoglobin: 11.6 g/dL — ABNORMAL LOW (ref 12.0–15.0)
MCH: 30 pg (ref 26.0–34.0)
MCHC: 34.5 g/dL (ref 30.0–36.0)
MCV: 86.8 fL (ref 80.0–100.0)
Platelets: 174 10*3/uL (ref 150–400)
RBC: 3.87 MIL/uL (ref 3.87–5.11)
RDW: 12.5 % (ref 11.5–15.5)
WBC: 11 10*3/uL — ABNORMAL HIGH (ref 4.0–10.5)
nRBC: 0 % (ref 0.0–0.2)

## 2020-10-14 LAB — GLUCOSE, CAPILLARY
Glucose-Capillary: 78 mg/dL (ref 70–99)
Glucose-Capillary: 71 mg/dL (ref 70–99)
Glucose-Capillary: 85 mg/dL (ref 70–99)

## 2020-10-14 LAB — RPR: RPR Ser Ql: NONREACTIVE

## 2020-10-14 MED ORDER — LACTATED RINGERS IV SOLN: INTRAVENOUS | Status: DC

## 2020-10-14 MED ORDER — SOD CITRATE-CITRIC ACID 500-334 MG/5ML PO SOLN: 30.0000 mL | ORAL | Status: DC | PRN

## 2020-10-14 MED ORDER — IBUPROFEN 600 MG PO TABS
600.0000 mg | ORAL_TABLET | Freq: Four times a day (QID) | ORAL | Status: DC
  Administered 2020-10-14 – 2020-10-15 (×4): 600 mg via ORAL
  Filled 2020-10-14 (×4): qty 1

## 2020-10-14 MED ORDER — EPHEDRINE 5 MG/ML INJ: 10.0000 mg | INTRAVENOUS | Status: DC | PRN

## 2020-10-14 MED ORDER — OXYTOCIN-SODIUM CHLORIDE 30-0.9 UT/500ML-% IV SOLN
1.0000 m[IU]/min | INTRAVENOUS | Status: DC
  Administered 2020-10-14: 2 m[IU]/min via INTRAVENOUS
  Filled 2020-10-14: qty 500

## 2020-10-14 MED ORDER — PRENATAL MULTIVITAMIN CH
1.0000 | ORAL_TABLET | Freq: Every day | ORAL | Status: DC
  Administered 2020-10-15: 1 via ORAL
  Filled 2020-10-14: qty 1

## 2020-10-14 MED ORDER — SIMETHICONE 80 MG PO CHEW: 80.0000 mg | CHEWABLE_TABLET | ORAL | Status: DC | PRN

## 2020-10-14 MED ORDER — OXYCODONE-ACETAMINOPHEN 5-325 MG PO TABS: 2.0000 | ORAL_TABLET | ORAL | Status: DC | PRN

## 2020-10-14 MED ORDER — ZOLPIDEM TARTRATE 5 MG PO TABS: 5.0000 mg | ORAL_TABLET | Freq: Every evening | ORAL | Status: DC | PRN

## 2020-10-14 MED ORDER — OXYTOCIN BOLUS FROM INFUSION: 333.0000 mL | Freq: Once | INTRAVENOUS | Status: DC

## 2020-10-14 MED ORDER — PHENYLEPHRINE 40 MCG/ML (10ML) SYRINGE FOR IV PUSH (FOR BLOOD PRESSURE SUPPORT): 80.0000 ug | PREFILLED_SYRINGE | INTRAVENOUS | Status: DC | PRN

## 2020-10-14 MED ORDER — ONDANSETRON HCL 4 MG/2ML IJ SOLN: 4.0000 mg | Freq: Four times a day (QID) | INTRAMUSCULAR | Status: DC | PRN

## 2020-10-14 MED ORDER — FENTANYL-BUPIVACAINE-NACL 0.5-0.125-0.9 MG/250ML-% EP SOLN
12.0000 mL/h | EPIDURAL | Status: DC | PRN
  Administered 2020-10-14: 12 mL/h via EPIDURAL
  Filled 2020-10-14: qty 250

## 2020-10-14 MED ORDER — ONDANSETRON HCL 4 MG PO TABS: 4.0000 mg | ORAL_TABLET | ORAL | Status: DC | PRN

## 2020-10-14 MED ORDER — LACTATED RINGERS IV SOLN: 500.0000 mL | Freq: Once | INTRAVENOUS | Status: DC

## 2020-10-14 MED ORDER — DIPHENHYDRAMINE HCL 25 MG PO CAPS: 25.0000 mg | ORAL_CAPSULE | Freq: Four times a day (QID) | ORAL | Status: DC | PRN

## 2020-10-14 MED ORDER — LIDOCAINE HCL (PF) 1 % IJ SOLN: 30.0000 mL | INTRAMUSCULAR | Status: DC | PRN

## 2020-10-14 MED ORDER — COCONUT OIL OIL: 1.0000 "application " | TOPICAL_OIL | Status: DC | PRN

## 2020-10-14 MED ORDER — TETANUS-DIPHTH-ACELL PERTUSSIS 5-2.5-18.5 LF-MCG/0.5 IM SUSY: 0.5000 mL | PREFILLED_SYRINGE | Freq: Once | INTRAMUSCULAR | Status: DC

## 2020-10-14 MED ORDER — TERBUTALINE SULFATE 1 MG/ML IJ SOLN: 0.2500 mg | Freq: Once | INTRAMUSCULAR | Status: DC | PRN

## 2020-10-14 MED ORDER — BENZOCAINE-MENTHOL 20-0.5 % EX AERO: 1.0000 "application " | INHALATION_SPRAY | CUTANEOUS | Status: DC | PRN

## 2020-10-14 MED ORDER — LIDOCAINE-EPINEPHRINE (PF) 1.5 %-1:200000 IJ SOLN
INTRAMUSCULAR | Status: DC | PRN
  Administered 2020-10-14: 5 mL via EPIDURAL

## 2020-10-14 MED ORDER — OXYCODONE-ACETAMINOPHEN 5-325 MG PO TABS
1.0000 | ORAL_TABLET | ORAL | Status: DC | PRN
Start: 2020-10-14 — End: 2020-10-15

## 2020-10-14 MED ORDER — ONDANSETRON HCL 4 MG/2ML IJ SOLN: 4.0000 mg | INTRAMUSCULAR | Status: DC | PRN

## 2020-10-14 MED ORDER — DIBUCAINE (PERIANAL) 1 % EX OINT: 1.0000 "application " | TOPICAL_OINTMENT | CUTANEOUS | Status: DC | PRN

## 2020-10-14 MED ORDER — OXYTOCIN-SODIUM CHLORIDE 30-0.9 UT/500ML-% IV SOLN: 2.5000 [IU]/h | INTRAVENOUS | Status: DC

## 2020-10-14 MED ORDER — METHYLERGONOVINE MALEATE 0.2 MG/ML IJ SOLN
0.2000 mg | INTRAMUSCULAR | Status: DC | PRN
Start: 2020-10-14 — End: 2020-10-15

## 2020-10-14 MED ORDER — ACETAMINOPHEN 325 MG PO TABS
650.0000 mg | ORAL_TABLET | ORAL | Status: DC | PRN
  Administered 2020-10-14 – 2020-10-15 (×2): 650 mg via ORAL
  Filled 2020-10-14 (×2): qty 2

## 2020-10-14 MED ORDER — MISOPROSTOL 25 MCG QUARTER TABLET
25.0000 ug | ORAL_TABLET | ORAL | Status: DC | PRN
  Administered 2020-10-14: 25 ug via VAGINAL
  Filled 2020-10-14: qty 1

## 2020-10-14 MED ORDER — LACTATED RINGERS IV SOLN: 500.0000 mL | INTRAVENOUS | Status: DC | PRN

## 2020-10-14 MED ORDER — SENNOSIDES-DOCUSATE SODIUM 8.6-50 MG PO TABS
2.0000 | ORAL_TABLET | Freq: Every day | ORAL | Status: DC
  Administered 2020-10-15: 2 via ORAL
  Filled 2020-10-14: qty 2

## 2020-10-14 MED ORDER — METHYLERGONOVINE MALEATE 0.2 MG PO TABS: 0.2000 mg | ORAL_TABLET | ORAL | Status: DC | PRN

## 2020-10-14 MED ORDER — ACETAMINOPHEN 325 MG PO TABS: 650.0000 mg | ORAL_TABLET | ORAL | Status: DC | PRN

## 2020-10-14 MED ORDER — WITCH HAZEL-GLYCERIN EX PADS: 1.0000 "application " | MEDICATED_PAD | CUTANEOUS | Status: DC | PRN

## 2020-10-14 MED ORDER — DIPHENHYDRAMINE HCL 50 MG/ML IJ SOLN: 12.5000 mg | INTRAMUSCULAR | Status: DC | PRN

## 2020-10-14 NOTE — Anesthesia Postprocedure Evaluation (Signed)
Anesthesia Post Note  Patient: Maureen Carlson  Procedure(s) Performed: AN AD HOC LABOR EPIDURAL     Patient location during evaluation: Mother Baby Anesthesia Type: Epidural Level of consciousness: awake and alert Pain management: pain level controlled Vital Signs Assessment: post-procedure vital signs reviewed and stable Respiratory status: spontaneous breathing, nonlabored ventilation and respiratory function stable Cardiovascular status: stable Postop Assessment: no headache, no backache and epidural receding Anesthetic complications: no   No notable events documented.  Last Vitals:  Vitals:   10/14/20 1530 10/14/20 1627  BP: 128/86 121/81  Pulse: 89 97  Resp: 18 18  Temp: 36.8 C 36.6 C  SpO2: 98% 99%    Last Pain:  Vitals:   10/14/20 1627  TempSrc: Oral  PainSc: 0-No pain   Pain Goal:                Epidural/Spinal Function Cutaneous sensation: Tingles (10/14/20 1627), Patient able to flex knees: Yes (10/14/20 1627), Patient able to lift hips off bed: Yes (10/14/20 1627), Back pain beyond tenderness at insertion site: No (10/14/20 1627), Progressively worsening motor and/or sensory loss: No (10/14/20 1627), Bowel and/or bladder incontinence post epidural: No (10/14/20 1627)  [facility] Records

## 2020-10-14 NOTE — Anesthesia Procedure Notes (Signed)
Epidural Patient location during procedure: OB Start time: 10/14/2020 5:45 AM End time: 10/14/2020 5:55 AM  Staffing Anesthesiologist: Atilano Median, DO Performed: anesthesiologist   Preanesthetic Checklist Completed: patient identified, IV checked, site marked, risks and benefits discussed, surgical consent, monitors and equipment checked, pre-op evaluation and timeout performed  Epidural Patient position: sitting Prep: ChloraPrep Patient monitoring: heart rate, continuous pulse ox and blood pressure Approach: midline Location: L4-L5 Injection technique: LOR saline  Needle:  Needle type: Tuohy  Needle gauge: 17 G Needle length: 9 cm Needle insertion depth: 7 cm Catheter type: closed end flexible Catheter size: 20 Guage Catheter at skin depth: 12 cm Test dose: negative and 1.5% lidocaine  Assessment Events: blood not aspirated, injection not painful, no injection resistance and no paresthesia  Additional Notes  Patient identified. Risks/Benefits/Options discussed with patient including but not limited to bleeding, infection, nerve damage, paralysis, failed block, incomplete pain control, headache, blood pressure changes, nausea, vomiting, reactions to medications, itching and postpartum back pain. Confirmed with bedside nurse the patient's most recent platelet count. Confirmed with patient that they are not currently taking any anticoagulation, have any bleeding history or any family history of bleeding disorders. Patient expressed understanding and wished to proceed. All questions were answered. Sterile technique was used throughout the entire procedure. Please see nursing notes for vital signs. Test dose was given through epidural catheter and negative prior to continuing to dose epidural or start infusion. Warning signs of high block given to the patient including shortness of breath, tingling/numbness in hands, complete motor block, or any concerning symptoms with  instructions to call for help. Patient was given instructions on fall risk and not to get out of bed. All questions and concerns addressed with instructions to call with any issues or inadequate analgesia.    Reason for block:procedure for pain

## 2020-10-14 NOTE — Progress Notes (Addendum)
Maureen Carlson is a 30 y.o. G2P1001 at [redacted]w[redacted]d by LMP admitted for induction of labor due to Gestational diabetes.  Subjective: comfortable  Objective: BP 117/65   Pulse 75   Temp 98.2 F (36.8 C) (Oral)   Resp 18   Ht 5\' 7"  (1.702 m)   Wt 82.6 kg   SpO2 99%   BMI 28.52 kg/m  No intake/output data recorded. Total I/O In: -  Out: 1800 [Urine:1800]  FHT:  FHR: 145 bpm, variability: moderate,  accelerations:  Present,  decelerations:  Absent UC:   regular, every 2-4 minutes SVE:   6/80/-1/OT AROM- clear IUPC placed without difficutly  Labs: Lab Results  Component Value Date   WBC 11.0 (H) 10/14/2020   HGB 11.6 (L) 10/14/2020   HCT 33.6 (L) 10/14/2020   MCV 86.8 10/14/2020   PLT 174 10/14/2020   BS 78-85 Assessment / Plan:  Induction of labor due to gestational diabetes,  progressing well on pitocin  Labor: Progressing normally Preeclampsia:  no signs or symptoms of toxicity Fetal Wellbeing:  Category I Pain Control:  Epidural I/D:  n/a Anticipated MOD:  NSVD  Maureen Carlson J 10/14/2020, 10:04 AM

## 2020-10-14 NOTE — H&P (Addendum)
Maureen Carlson is a 30 y.o. female presenting for GDM and history of IUGR. OB History     Gravida  2   Para  1   Term  1   Preterm  0   AB  0   Living  1      SAB  0   IAB  0   Ectopic  0   Multiple  0   Live Births  1          Past Medical History:  Diagnosis Date   Gestational diabetes    Headache    Menorrhagia    Past Surgical History:  Procedure Laterality Date   ARTHROSCOPIC REPAIR ACL     TONSILLECTOMY     TONSILLECTOMY     Family History: family history includes Anemia in her mother; Anxiety disorder in her mother, paternal grandfather, and paternal uncle; Asthma in her brother; Cancer in her paternal grandfather; Heart attack in her mother; Hypertension in her father; Multiple births in her maternal grandmother; Pyelonephritis in her mother; Thyroid disease in her maternal grandmother and mother. Social History:  reports that she has never smoked. She has never used smokeless tobacco. She reports current alcohol use of about 1.0 - 2.0 standard drink of alcohol per week. She reports that she does not use drugs.     Maternal Diabetes: Yes:  Diabetes Type:  Diet controlled Genetic Screening: Normal Maternal Ultrasounds/Referrals: Normal Fetal Ultrasounds or other Referrals:  None Maternal Substance Abuse:  No Significant Maternal Medications:  None Significant Maternal Lab Results:  Group B Strep negative Other Comments:  None  Review of Systems  Constitutional: Negative.   All other systems reviewed and are negative. Maternal Medical History:  Reason for admission: Contractions.   Contractions: Onset was less than 1 hour ago.   Frequency: irregular.   Perceived severity is mild.   Fetal activity: Perceived fetal activity is normal.   Last perceived fetal movement was within the past hour.   Prenatal complications: no prenatal complications Prenatal Complications - Diabetes: gestational.  Dilation: 6 Effacement (%): 90 Station: -1 Exam  by:: Dyke Brackett, RN Blood pressure 112/69, pulse 78, temperature 98.2 F (36.8 C), temperature source Oral, resp. rate 16, height 5\' 7"  (1.702 m), weight 82.6 kg, SpO2 99 %, unknown if currently breastfeeding. Maternal Exam:  Uterine Assessment: Contraction strength is mild.  Contraction frequency is irregular.  Abdomen: Patient reports no abdominal tenderness. Introitus: Normal vulva. Normal vagina.  Ferning test: not done.  Nitrazine test: not done. Amniotic fluid character: not assessed. Pelvis: adequate for delivery.   Cervix: Cervix evaluated by digital exam.    Physical Exam Vitals and nursing note reviewed.  Constitutional:      Appearance: Normal appearance.  HENT:     Head: Normocephalic and atraumatic.  Cardiovascular:     Rate and Rhythm: Normal rate and regular rhythm.  Pulmonary:     Effort: Pulmonary effort is normal.     Breath sounds: Normal breath sounds.  Abdominal:     General: Abdomen is flat.     Palpations: Abdomen is soft.  Genitourinary:    General: Normal vulva.  Musculoskeletal:        General: Normal range of motion.     Cervical back: Normal range of motion and neck supple.  Skin:    General: Skin is warm and dry.  Neurological:     General: No focal deficit present.     Mental Status: She is alert and  oriented to person, place, and time.  Psychiatric:        Mood and Affect: Mood normal.        Behavior: Behavior normal.    Prenatal labs: ABO, Rh: --/--/O POS (07/31 0042) Antibody: NEG (07/31 0042) Rubella:  imm RPR:   neg HBsAg:   neg HIV:   neg GBS:   neg  Assessment/Plan: 39wks GDM History of IUGR IOL   Maureen Carlson 10/14/2020, 8:44 AM

## 2020-10-14 NOTE — Lactation Note (Addendum)
This note was copied from a baby's chart. Lactation Consultation Note  Patient Name: Maureen Carlson [id number] Date: 10/14/2020 Reason for consult: L&D Initial assessment;Term;Other (Comment) (P2, LC arrived at 2 PP. per LD RN attempt to latch/ baby fussy. LC offered to assist / mom receptive /after several attempts baby latched Rt Br 7 mins ( occasionally fussy) . baby released / Burped x 2, and re- latched  Lt. breast  still feeding 8 mins) Age:30  mins  LC RN aware to document the total time for the 2nd latch.  Maternal Data Has patient been taught Hand Expression?: Yes Does the patient have breastfeeding experience prior to this delivery?: Yes How long did the patient breastfeed?: per mom 8 months and then pumped  Feeding Mother's Current Feeding Choice: Breast Milk  LATCH Score Latch: Grasps breast easily, tongue down, lips flanged, rhythmical sucking.  Audible Swallowing: A few with stimulation  Type of Nipple: Everted at rest and after stimulation  Comfort (Breast/Nipple): Soft / non-tender  Hold (Positioning): Assistance needed to correctly position infant at breast and maintain latch.  LATCH Score: 8   Lactation Tools Discussed/Used    Interventions Interventions: Breast feeding basics reviewed;Assisted with latch;Skin to skin;Breast massage;Hand express;Breast compression;Adjust position;Support pillows;Education  Discharge    Consult Status Consult Status: Follow-up (from LD) Date: 10/14/20 Follow-up type: In-patient    Matilde Sprang Hector Venne 10/14/2020, 2:36 PM

## 2020-10-14 NOTE — Lactation Note (Signed)
This note was copied from a baby's chart. Lactation Consultation Note  Patient Name: Maureen Carlson [id number] Date: 10/14/2020   Age:30 hours LC entered the room, mom would prefer if [facility] services come back later due to resting at this time.  Maternal Data    Feeding    LATCH Score                    Lactation Tools Discussed/Used    Interventions    Discharge    Consult Status      [name] 10/14/2020, 10:30 PM

## 2020-10-14 NOTE — Anesthesia Preprocedure Evaluation (Signed)
Anesthesia Evaluation  Patient identified by MRN, date of birth, ID band Patient awake    Reviewed: Allergy & Precautions, NPO status , Patient's Chart, lab work & pertinent test results  Airway Mallampati: II  TM Distance: >3 FB Neck ROM: Full    Dental  (+) Teeth Intact   Pulmonary neg pulmonary ROS,    Pulmonary exam normal        Cardiovascular negative cardio ROS   Rhythm:Regular Rate:Normal     Neuro/Psych  Headaches, negative psych ROS   GI/Hepatic negative GI ROS, Neg liver ROS,   Endo/Other  diabetes, Gestational  Renal/GU negative Renal ROS  negative genitourinary   Musculoskeletal negative musculoskeletal ROS (+)   Abdominal (+)  Abdomen: soft.    Peds  Hematology negative hematology ROS (+)   Anesthesia Other Findings   Reproductive/Obstetrics (+) Pregnancy                             Anesthesia Physical Anesthesia Plan  ASA: 2  Anesthesia Plan: Epidural   Post-op Pain Management:    Induction:   PONV Risk Score and Plan: 2 and Treatment may vary due to age or medical condition  Airway Management Planned: Natural Airway  Additional Equipment: None  Intra-op Plan:   Post-operative Plan:   Informed Consent: I have reviewed the patients History and Physical, chart, labs and discussed the procedure including the risks, benefits and alternatives for the proposed anesthesia with the patient or authorized representative who has indicated his/her understanding and acceptance.     Dental advisory given  Plan Discussed with:   Anesthesia Plan Comments: (Lab Results      Component                Value               Date                      WBC                      11.0 (H)            10/14/2020                HGB                      11.6 (L)            10/14/2020                HCT                      33.6 (L)            10/14/2020                MCV                       86.8                10/14/2020                PLT                      174                 10/14/2020          )  Anesthesia Quick Evaluation  

## 2020-10-14 NOTE — Plan of Care (Signed)

## 2020-10-15 DIAGNOSIS — O24410 Gestational diabetes mellitus in pregnancy, diet controlled: Secondary | ICD-10-CM

## 2020-10-15 LAB — CBC
HCT: 32.4 % — ABNORMAL LOW (ref 36.0–46.0)
Hemoglobin: 11 g/dL — ABNORMAL LOW (ref 12.0–15.0)
MCH: 30.1 pg (ref 26.0–34.0)
MCHC: 34 g/dL (ref 30.0–36.0)
MCV: 88.5 fL (ref 80.0–100.0)
Platelets: 141 10*3/uL — ABNORMAL LOW (ref 150–400)
RBC: 3.66 MIL/uL — ABNORMAL LOW (ref 3.87–5.11)
RDW: 12.5 % (ref 11.5–15.5)
WBC: 14.4 10*3/uL — ABNORMAL HIGH (ref 4.0–10.5)
nRBC: 0 % (ref 0.0–0.2)

## 2020-10-15 MED ORDER — IBUPROFEN 800 MG PO TABS: 800.0000 mg | ORAL_TABLET | Freq: Four times a day (QID) | ORAL | 0 refills | Status: DC

## 2020-10-15 MED ORDER — COCONUT OIL OIL: 1.0000 "application " | TOPICAL_OIL | 0 refills | Status: DC | PRN

## 2020-10-15 MED ORDER — ACETAMINOPHEN 500 MG PO TABS
1000.0000 mg | ORAL_TABLET | Freq: Four times a day (QID) | ORAL | Status: DC | PRN
  Administered 2020-10-15: 1000 mg via ORAL
  Filled 2020-10-15: qty 2

## 2020-10-15 MED ORDER — ACETAMINOPHEN 500 MG PO TABS: 1000.0000 mg | ORAL_TABLET | Freq: Four times a day (QID) | ORAL | 0 refills | Status: DC | PRN

## 2020-10-15 MED ORDER — IBUPROFEN 800 MG PO TABS: 800.0000 mg | ORAL_TABLET | Freq: Four times a day (QID) | ORAL | Status: DC

## 2020-10-15 NOTE — Lactation Note (Signed)
This note was copied from a baby's chart. Lactation Consultation Note  Patient Name: [name] [id number] Date: 10/15/2020 Reason for consult: Follow-up assessment;Infant weight loss;Term;Nipple pain/trauma;Other (Comment) (early D/C) Age:30 hours -  Per mom baby recently fed ,  LC reviewed and updated the doc flow sheets per mom.  Baby awake and rooting/ LC reviewed and guided mom latching in the cross cradle with depth and per mom comfortable. - Flanged lips noted.  LC reviewed BF D/C teaching.  Latch score of 8  Mom has the [facility] brochure with resource numbers.   Maternal Data Has patient been taught Hand Expression?: Yes  Feeding Mother's Current Feeding Choice: Breast Milk  LATCH Score Latch: Grasps breast easily, tongue down, lips flanged, rhythmical sucking.  Audible Swallowing: Spontaneous and intermittent  Type of Nipple: Everted at rest and after stimulation  Comfort (Breast/Nipple): Filling, red/small blisters or bruises, mild/mod discomfort  Hold (Positioning): Assistance needed to correctly position infant at breast and maintain latch.  LATCH Score: 8   Lactation Tools Discussed/Used Tools: Shells;Pump;Flanges;Comfort gels Flange Size: 24;27 Breast pump type: Manual Pump Education: Milk Storage  Interventions Interventions: Breast feeding basics reviewed;Assisted with latch;Skin to skin;Breast massage;Hand express;Breast compression;Adjust position;Support pillows;Position options;Shells;Comfort gels;Hand pump;Education  Discharge Discharge Education: Engorgement and breast care;Warning signs for feeding baby Pump: Personal;Manual;DEBP WIC Program: No  Consult Status Consult Status: Complete Date: 10/15/20    Kathrin Greathouse 10/15/2020, 2:09 PM

## 2020-10-15 NOTE — Discharge Summary (Signed)
Postpartum Discharge Summary  Date of Service updated 10/15/2020     Patient Name: Maureen Carlson DOB: Jan 02, 1991 MRN: [id number]  Date of admission: 10/14/2020 Delivery date:10/14/2020  Delivering provider: Brien Few  Date of discharge: 10/15/2020  Admitting diagnosis: Encounter for induction of labor [Z34.90] Intrauterine pregnancy: [redacted]w[redacted]d     Secondary diagnosis:  Principal Problem:   Postpartum care following vaginal delivery 7/31 Active Problems:   Encounter for induction of labor   SVD (spontaneous vaginal delivery)   GDM, class A1  Additional problems: n/a    Discharge diagnosis: Term Pregnancy Delivered and GDM A1                                              Post partum procedures: n/a Augmentation: AROM, Pitocin, and Cytotec Complications: None  Hospital course: Induction of Labor With Vaginal Delivery   30 y.o. yo A1P3790 at [redacted]w[redacted]d was admitted to the hospital 10/14/2020 for induction of labor.  Indication for induction: A1 DM and hx. Of IUGR with last pregnancy .  Patient had an uncomplicated labor course as follows: Membrane Rupture Time/Date: 10:00 AM ,10/14/2020   Delivery Method:Vaginal, Spontaneous  Episiotomy: None  Lacerations:  None  Details of delivery can be found in separate delivery note.  Patient had a routine postpartum course. She was ambulating, urinating, breastfeeding well. Pt. Desires early d/c home at 24 hours. Patient is discharged home 10/15/20.  Newborn Data: Birth date:10/14/2020  Birth time:1:22 PM  Gender:Female  Living status:Living  Apgars:8 ,9  Weight:3605 g  "[name]"  Magnesium Sulfate received: No BMZ received: No Rhophylac:No MMR:N/A T-DaP:Given prenatally Flu: N/A Transfusion:No  Physical exam  Vitals:   10/14/20 1627 10/14/20 2053 10/15/20 0013 10/15/20 0525  BP: 121/81 131/78 110/61 115/74  Pulse: 97 77 78 67  Resp: $Remo'18 18 18 18  'zxdeh$ Temp: 97.9 F (36.6 C) 98.3 F (36.8 C) 98.3 F (36.8 C) 97.8 F (36.6 C)   TempSrc: Oral Oral Oral Oral  SpO2: 99% 100% 99% 100%  Weight:      Height:       General: alert, cooperative, and no distress Heart: RRR Lungs: clear  Lochia: appropriate Uterine Fundus: firm, U-3 Incision: N/A DVT Evaluation: No evidence of DVT seen on physical exam. Negative Homan's sign. No cords or calf tenderness. Trac pedal edema  Labs: Lab Results  Component Value Date   WBC 14.4 (H) 10/15/2020   HGB 11.0 (L) 10/15/2020   HCT 32.4 (L) 10/15/2020   MCV 88.5 10/15/2020   PLT 141 (L) 10/15/2020   No flowsheet data found. Edinburgh Score: Edinburgh Postnatal Depression Scale Screening Tool 12/26/2016  I have been able to laugh and see the funny side of things. 0  I have looked forward with enjoyment to things. 0  I have blamed myself unnecessarily when things went wrong. 1  I have been anxious or worried for no good reason. 2  I have felt scared or panicky for no good reason. 0  Things have been getting on top of me. 1  I have been so unhappy that I have had difficulty sleeping. 0  I have felt sad or miserable. 0  I have been so unhappy that I have been crying. 0  The thought of harming myself has occurred to me. 0  Edinburgh Postnatal Depression Scale Total 4  After visit meds:  Allergies as of 10/15/2020   No Known Allergies      Medication List     STOP taking these medications    valACYclovir 1000 MG tablet Commonly known as: Valtrex       TAKE these medications    acetaminophen 500 MG tablet Commonly known as: TYLENOL Take 2 tablets (1,000 mg total) by mouth every 6 (six) hours as needed (for pain scale < 4).   coconut oil Oil Apply 1 application topically as needed.   ibuprofen 800 MG tablet Commonly known as: ADVIL Take 1 tablet (800 mg total) by mouth every 6 (six) hours. What changed:  medication strength how much to take when to take this   prenatal multivitamin Tabs tablet Take 1 tablet by mouth daily at 12 noon.          Discharge home in stable condition Infant Feeding: Breast Infant Disposition:home with mother Discharge instruction: per After Visit Summary and Postpartum booklet. Activity: Advance as tolerated. Pelvic rest for 6 weeks.  Diet: carb modified diet and low salt diet Anticipated Birth Control:  not discussed Postpartum Appointment:6 weeks Additional Postpartum F/U: 2 hour GTT Future Appointments:No future appointments. Follow up Visit:  Follow-up Information     Brien Few, MD Follow up in 6 week(s).   Specialty: Obstetrics and Gynecology Why: Postpartum visit Contact information: [place] [address] [phone]                     10/15/2020 Darliss Cheney, CNM

## 2020-11-15 DIAGNOSIS — Z419 Encounter for procedure for purposes other than remedying health state, unspecified: Secondary | ICD-10-CM | POA: Diagnosis not present

## 2020-12-15 DIAGNOSIS — Z419 Encounter for procedure for purposes other than remedying health state, unspecified: Secondary | ICD-10-CM | POA: Diagnosis not present

## 2020-12-17 NOTE — Progress Notes (Signed)
Maureen Carlson [address] [place] [address] Phone: [phone] Subjective:   Maureen Carlson, am serving as a scribe for Dr. Antoine Primas. This visit occurred during the SARS-CoV-2 public health emergency.  Safety protocols were in place, including screening questions prior to the visit, additional usage of staff PPE, and extensive cleaning of exam room while observing appropriate contact time as indicated for disinfecting solutions.    CC: left knee pain   ERD:[id number]  Maureen Carlson is a 30 y.o. female coming in with complaint of left knee pain. She feels a dull achy pain in her left knee momentarily when she sits for a while and then gets up and walks (pain dissipates once walking for a little) She felt a sharp pain medial side when knee was in full flexion and she put pressure on it. This was also momentary.      Had child 7/31  Past Medical History:  Diagnosis Date   Gestational diabetes    Headache    Menorrhagia    Past Surgical History:  Procedure Laterality Date   ARTHROSCOPIC REPAIR ACL     TONSILLECTOMY     TONSILLECTOMY     Social History   Socioeconomic History   Marital status: Married    Spouse name: Not on file   Number of children: Not on file   Years of education: Not on file   Highest education level: Not on file  Occupational History   Not on file  Tobacco Use   Smoking status: Never   Smokeless tobacco: Never  Substance and Sexual Activity   Alcohol use: Yes    Alcohol/week: 1.0 - 2.0 standard drink    Types: 1 - 2 Standard drinks or equivalent per week    Comment: socially   Drug use: No   Sexual activity: Yes    Partners: Male  Other Topics Concern   Not on file  Social History Narrative   Not on file   Social Determinants of Health   Financial Resource Strain: Not on file  Food Insecurity: No Food Insecurity   Worried About Programme researcher, broadcasting/film/video in the Last Year: Never true   Ran Out of Food in  the Last Year: Never true  Transportation Needs: Not on file  Physical Activity: Not on file  Stress: Not on file  Social Connections: Not on file   No Known Allergies Family History  Problem Relation Age of Onset   Anxiety disorder Mother    Heart attack Mother    Anemia Mother    Pyelonephritis Mother    Thyroid disease Mother    Anxiety disorder Paternal Uncle    Multiple births Maternal Grandmother    Thyroid disease Maternal Grandmother    Cancer Paternal Grandfather    Anxiety disorder Paternal Grandfather    Hypertension Father    Asthma Brother        Current Outpatient Medications (Analgesics):    acetaminophen (TYLENOL) 500 MG tablet, Take 2 tablets (1,000 mg total) by mouth every 6 (six) hours as needed (for pain scale < 4).   ibuprofen (ADVIL) 800 MG tablet, Take 1 tablet (800 mg total) by mouth every 6 (six) hours.   Current Outpatient Medications (Other):    coconut oil OIL, Apply 1 application topically as needed.   Prenatal Vit-Fe Fumarate-FA (PRENATAL MULTIVITAMIN) TABS tablet, Take 1 tablet by mouth daily at 12 noon.   Reviewed prior external information including notes and imaging  from  primary care provider As well as notes that were available from care everywhere and other healthcare systems.  Past medical history, social, surgical and family history all reviewed in electronic medical record.  No pertanent information unless stated regarding to the chief complaint.   Review of Systems:  No headache, visual changes, nausea, vomiting, diarrhea, constipation, dizziness, abdominal pain, skin rash, fevers, chills, night sweats, weight loss, swollen lymph nodes, body aches, joint swelling, chest pain, shortness of breath, mood changes. POSITIVE muscle aches  Objective  Blood pressure 110/74, pulse 79, height 5\' 7"  (1.702 m), weight 160 lb (72.6 kg), SpO2 99 %, currently breastfeeding.   General: No apparent distress alert and oriented x3 mood and affect  normal, dressed appropriately.  HEENT: Pupils equal, extraocular movements intact  Respiratory: Patient's speak in full sentences and does not appear short of breath  Cardiovascular: No lower extremity edema, non tender, no erythema  Gait normal with good balance and coordination.  MSK: Left knee exam shows the patient has good stability with valgus and varus force.  Patient does have full range of motion with flexion extension.  Tender to palpation over the Pez anserine area more than the medial joint line.  Tightness in the hamstring on the left compared to the right.  Limited muscular skeletal ultrasound was performed and interpreted by , M  Limited ultrasound of patient's knee shows that there is hypoechoic changes over the pes anserine area.  Medial and lateral meniscus appear to be unremarkable.  Good articular cartilage noted at the patellofemoral joint.  Otherwise unremarkable. Impression: Pes anserine bursitis   Impression and Recommendations:     The above documentation has been reviewed and is accurate and complete Antoine Primas, DO

## 2020-12-18 ENCOUNTER — Other Ambulatory Visit: Payer: Self-pay

## 2020-12-18 ENCOUNTER — Ambulatory Visit (INDEPENDENT_AMBULATORY_CARE_PROVIDER_SITE_OTHER): Payer: BC Managed Care – PPO | Admitting: Family Medicine

## 2020-12-18 ENCOUNTER — Ambulatory Visit (INDEPENDENT_AMBULATORY_CARE_PROVIDER_SITE_OTHER): Payer: BC Managed Care – PPO

## 2020-12-18 ENCOUNTER — Encounter: Payer: Self-pay | Admitting: Family Medicine

## 2020-12-18 ENCOUNTER — Ambulatory Visit: Payer: Self-pay

## 2020-12-18 VITALS — BP 110/74 | HR 79 | Ht 67.0 in | Wt 160.0 lb

## 2020-12-18 DIAGNOSIS — M25562 Pain in left knee: Secondary | ICD-10-CM | POA: Diagnosis not present

## 2020-12-18 DIAGNOSIS — M7050 Other bursitis of knee, unspecified knee: Secondary | ICD-10-CM | POA: Insufficient documentation

## 2020-12-18 NOTE — Assessment & Plan Note (Signed)
Patient does have more of a bursitis noted.  Discussed icing regimen and home exercises, discussed which activities to do which wants to avoid.  Increase activity slowly.  Discussed thigh compression sleeve as well as heel lifts.  Discussed avoiding certain activities and topical anti-inflammatories.  Discussed icing regimen.  Follow-up again in 4 to 6 weeks and if continuing to have pain consider the possibility of injection.

## 2020-12-18 NOTE — Patient Instructions (Addendum)
Xray today Voltaren and Ice Wear heel lifts, thigh compression sleeve Do prescribed exercises at least 3x a week See you again in 4-6 weeks

## 2021-01-15 DIAGNOSIS — Z419 Encounter for procedure for purposes other than remedying health state, unspecified: Secondary | ICD-10-CM | POA: Diagnosis not present

## 2021-01-22 ENCOUNTER — Ambulatory Visit: Payer: BC Managed Care – PPO | Admitting: Family Medicine

## 2021-02-14 DIAGNOSIS — Z419 Encounter for procedure for purposes other than remedying health state, unspecified: Secondary | ICD-10-CM | POA: Diagnosis not present

## 2021-03-17 DIAGNOSIS — Z419 Encounter for procedure for purposes other than remedying health state, unspecified: Secondary | ICD-10-CM | POA: Diagnosis not present

## 2021-04-17 DIAGNOSIS — Z419 Encounter for procedure for purposes other than remedying health state, unspecified: Secondary | ICD-10-CM | POA: Diagnosis not present

## 2021-05-03 DIAGNOSIS — Z20822 Contact with and (suspected) exposure to covid-19: Secondary | ICD-10-CM | POA: Diagnosis not present

## 2021-05-03 DIAGNOSIS — B9689 Other specified bacterial agents as the cause of diseases classified elsewhere: Secondary | ICD-10-CM | POA: Diagnosis not present

## 2021-05-03 DIAGNOSIS — J019 Acute sinusitis, unspecified: Secondary | ICD-10-CM | POA: Diagnosis not present

## 2021-05-15 DIAGNOSIS — Z419 Encounter for procedure for purposes other than remedying health state, unspecified: Secondary | ICD-10-CM | POA: Diagnosis not present

## 2021-06-15 DIAGNOSIS — Z419 Encounter for procedure for purposes other than remedying health state, unspecified: Secondary | ICD-10-CM | POA: Diagnosis not present

## 2021-07-15 DIAGNOSIS — Z419 Encounter for procedure for purposes other than remedying health state, unspecified: Secondary | ICD-10-CM | POA: Diagnosis not present

## 2021-08-15 DIAGNOSIS — Z419 Encounter for procedure for purposes other than remedying health state, unspecified: Secondary | ICD-10-CM | POA: Diagnosis not present

## 2021-09-14 DIAGNOSIS — Z419 Encounter for procedure for purposes other than remedying health state, unspecified: Secondary | ICD-10-CM | POA: Diagnosis not present

## 2021-09-25 LAB — HEPATITIS C ANTIBODY: HCV Ab: NEGATIVE

## 2021-09-25 LAB — OB RESULTS CONSOLE HEPATITIS B SURFACE ANTIGEN: Hepatitis B Surface Ag: NEGATIVE

## 2021-09-25 LAB — OB RESULTS CONSOLE RUBELLA ANTIBODY, IGM: Rubella: IMMUNE

## 2021-09-25 LAB — OB RESULTS CONSOLE RPR: RPR: NONREACTIVE

## 2021-09-26 LAB — OB RESULTS CONSOLE HIV ANTIBODY (ROUTINE TESTING): HIV: NONREACTIVE

## 2021-10-15 DIAGNOSIS — Z419 Encounter for procedure for purposes other than remedying health state, unspecified: Secondary | ICD-10-CM | POA: Diagnosis not present

## 2021-10-16 LAB — OB RESULTS CONSOLE GC/CHLAMYDIA
Chlamydia: NEGATIVE
Neisseria Gonorrhea: NEGATIVE

## 2021-10-24 DIAGNOSIS — Z8632 Personal history of gestational diabetes: Secondary | ICD-10-CM | POA: Insufficient documentation

## 2021-11-15 DIAGNOSIS — Z419 Encounter for procedure for purposes other than remedying health state, unspecified: Secondary | ICD-10-CM | POA: Diagnosis not present

## 2021-12-15 DIAGNOSIS — Z419 Encounter for procedure for purposes other than remedying health state, unspecified: Secondary | ICD-10-CM | POA: Diagnosis not present

## 2022-01-15 DIAGNOSIS — Z419 Encounter for procedure for purposes other than remedying health state, unspecified: Secondary | ICD-10-CM | POA: Diagnosis not present

## 2022-02-14 DIAGNOSIS — Z419 Encounter for procedure for purposes other than remedying health state, unspecified: Secondary | ICD-10-CM | POA: Diagnosis not present

## 2022-03-08 DIAGNOSIS — R5383 Other fatigue: Secondary | ICD-10-CM | POA: Diagnosis not present

## 2022-03-08 DIAGNOSIS — J029 Acute pharyngitis, unspecified: Secondary | ICD-10-CM | POA: Diagnosis not present

## 2022-03-08 DIAGNOSIS — R52 Pain, unspecified: Secondary | ICD-10-CM | POA: Diagnosis not present

## 2022-03-08 DIAGNOSIS — J09X2 Influenza due to identified novel influenza A virus with other respiratory manifestations: Secondary | ICD-10-CM | POA: Diagnosis not present

## 2022-03-13 DIAGNOSIS — Z3A35 35 weeks gestation of pregnancy: Secondary | ICD-10-CM | POA: Diagnosis not present

## 2022-03-13 DIAGNOSIS — Z3493 Encounter for supervision of normal pregnancy, unspecified, third trimester: Secondary | ICD-10-CM | POA: Diagnosis not present

## 2022-03-13 LAB — OB RESULTS CONSOLE GBS: GBS: NEGATIVE

## 2022-03-17 DIAGNOSIS — Z419 Encounter for procedure for purposes other than remedying health state, unspecified: Secondary | ICD-10-CM | POA: Diagnosis not present

## 2022-03-17 NOTE — L&D Delivery Note (Signed)
Delivery Note At 2:37 PM a viable and healthy female was delivered via Vaginal, Spontaneous (Presentation: Right Occiput Anterior).  APGAR: 9, 9; weight 8 lb 3 oz (3714 g).   Placenta status: Spontaneous, Intact.  Cord: 3 vessels with the following complications: None.  Cord pH: na  Anesthesia: Epidural Episiotomy: None Lacerations: None Suture Repair:  na Est. Blood Loss (mL): 59  Mom to postpartum.  Baby to Couplet care / Skin to Skin.  Hager Compston J 04/10/2022, 4:51 PM

## 2022-03-18 DIAGNOSIS — Z3A36 36 weeks gestation of pregnancy: Secondary | ICD-10-CM | POA: Diagnosis not present

## 2022-03-18 DIAGNOSIS — Z3483 Encounter for supervision of other normal pregnancy, third trimester: Secondary | ICD-10-CM | POA: Diagnosis not present

## 2022-03-25 DIAGNOSIS — Z3483 Encounter for supervision of other normal pregnancy, third trimester: Secondary | ICD-10-CM | POA: Diagnosis not present

## 2022-03-25 DIAGNOSIS — Z3A37 37 weeks gestation of pregnancy: Secondary | ICD-10-CM | POA: Diagnosis not present

## 2022-04-07 ENCOUNTER — Encounter (HOSPITAL_COMMUNITY): Payer: Self-pay | Admitting: *Deleted

## 2022-04-07 ENCOUNTER — Telehealth (HOSPITAL_COMMUNITY): Payer: Self-pay | Admitting: *Deleted

## 2022-04-07 NOTE — Telephone Encounter (Signed)
Preadmission screen  

## 2022-04-09 ENCOUNTER — Other Ambulatory Visit: Payer: Self-pay | Admitting: Obstetrics and Gynecology

## 2022-04-09 DIAGNOSIS — O26843 Uterine size-date discrepancy, third trimester: Secondary | ICD-10-CM | POA: Diagnosis not present

## 2022-04-09 DIAGNOSIS — Z3A39 39 weeks gestation of pregnancy: Secondary | ICD-10-CM | POA: Diagnosis not present

## 2022-04-10 ENCOUNTER — Inpatient Hospital Stay (HOSPITAL_COMMUNITY): Payer: Medicaid Other | Admitting: Anesthesiology

## 2022-04-10 ENCOUNTER — Inpatient Hospital Stay (HOSPITAL_COMMUNITY): Payer: Medicaid Other

## 2022-04-10 ENCOUNTER — Encounter (HOSPITAL_COMMUNITY): Payer: Self-pay | Admitting: Obstetrics and Gynecology

## 2022-04-10 ENCOUNTER — Other Ambulatory Visit: Payer: Self-pay

## 2022-04-10 ENCOUNTER — Inpatient Hospital Stay (HOSPITAL_COMMUNITY)
Admission: RE | Admit: 2022-04-10 | Discharge: 2022-04-11 | DRG: 806 | Disposition: A | Discharge status: Home / Self Care | Payer: Medicaid Other | Attending: Obstetrics and Gynecology | Admitting: Obstetrics and Gynecology

## 2022-04-10 DIAGNOSIS — Z3A39 39 weeks gestation of pregnancy: Secondary | ICD-10-CM | POA: Diagnosis not present

## 2022-04-10 DIAGNOSIS — Z3490 Encounter for supervision of normal pregnancy, unspecified, unspecified trimester: Secondary | ICD-10-CM | POA: Diagnosis present

## 2022-04-10 DIAGNOSIS — Z8632 Personal history of gestational diabetes: Secondary | ICD-10-CM | POA: Diagnosis not present

## 2022-04-10 DIAGNOSIS — D696 Thrombocytopenia, unspecified: Secondary | ICD-10-CM | POA: Diagnosis present

## 2022-04-10 DIAGNOSIS — O403XX0 Polyhydramnios, third trimester, not applicable or unspecified: Secondary | ICD-10-CM | POA: Diagnosis present

## 2022-04-10 DIAGNOSIS — O3663X0 Maternal care for excessive fetal growth, third trimester, not applicable or unspecified: Principal | ICD-10-CM | POA: Diagnosis present

## 2022-04-10 DIAGNOSIS — O9912 Other diseases of the blood and blood-forming organs and certain disorders involving the immune mechanism complicating childbirth: Secondary | ICD-10-CM | POA: Diagnosis present

## 2022-04-10 DIAGNOSIS — Z412 Encounter for routine and ritual male circumcision: Secondary | ICD-10-CM | POA: Diagnosis not present

## 2022-04-10 DIAGNOSIS — O9902 Anemia complicating childbirth: Secondary | ICD-10-CM | POA: Diagnosis present

## 2022-04-10 LAB — CBC
HCT: 38.6 % (ref 36.0–46.0)
Hemoglobin: 12.5 g/dL (ref 12.0–15.0)
MCH: 28 pg (ref 26.0–34.0)
MCHC: 32.4 g/dL (ref 30.0–36.0)
MCV: 86.5 fL (ref 80.0–100.0)
Platelets: 185 10*3/uL (ref 150–400)
RBC: 4.46 MIL/uL (ref 3.87–5.11)
RDW: 12.9 % (ref 11.5–15.5)
WBC: 8.7 10*3/uL (ref 4.0–10.5)
nRBC: 0 % (ref 0.0–0.2)

## 2022-04-10 LAB — RPR: RPR Ser Ql: NONREACTIVE

## 2022-04-10 LAB — TYPE AND SCREEN
ABO/RH(D): O POS
Antibody Screen: NEGATIVE

## 2022-04-10 MED ORDER — EPHEDRINE 5 MG/ML INJ: 10.0000 mg | INTRAVENOUS | Status: DC | PRN

## 2022-04-10 MED ORDER — FENTANYL-BUPIVACAINE-NACL 0.5-0.125-0.9 MG/250ML-% EP SOLN
EPIDURAL | Status: DC | PRN
  Administered 2022-04-10: 12 mL/h via EPIDURAL

## 2022-04-10 MED ORDER — LIDOCAINE HCL (PF) 1 % IJ SOLN: 30.0000 mL | INTRAMUSCULAR | Status: DC | PRN

## 2022-04-10 MED ORDER — OXYTOCIN-SODIUM CHLORIDE 30-0.9 UT/500ML-% IV SOLN
2.5000 [IU]/h | INTRAVENOUS | Status: DC
  Filled 2022-04-10: qty 500

## 2022-04-10 MED ORDER — LACTATED RINGERS AMNIOINFUSION: INTRAVENOUS | Status: DC

## 2022-04-10 MED ORDER — DIPHENHYDRAMINE HCL 50 MG/ML IJ SOLN: 12.5000 mg | INTRAMUSCULAR | Status: DC | PRN

## 2022-04-10 MED ORDER — SIMETHICONE 80 MG PO CHEW: 80.0000 mg | CHEWABLE_TABLET | ORAL | Status: DC | PRN

## 2022-04-10 MED ORDER — OXYTOCIN BOLUS FROM INFUSION
333.0000 mL | Freq: Once | INTRAVENOUS | Status: AC
  Administered 2022-04-10: 333 mL via INTRAVENOUS

## 2022-04-10 MED ORDER — IBUPROFEN 600 MG PO TABS
600.0000 mg | ORAL_TABLET | Freq: Four times a day (QID) | ORAL | Status: DC
  Administered 2022-04-10 – 2022-04-11 (×4): 600 mg via ORAL
  Filled 2022-04-10 (×4): qty 1

## 2022-04-10 MED ORDER — TETANUS-DIPHTH-ACELL PERTUSSIS 5-2.5-18.5 LF-MCG/0.5 IM SUSY: 0.5000 mL | PREFILLED_SYRINGE | Freq: Once | INTRAMUSCULAR | Status: DC

## 2022-04-10 MED ORDER — PRENATAL MULTIVITAMIN CH
1.0000 | ORAL_TABLET | Freq: Every day | ORAL | Status: DC
  Administered 2022-04-11: 1 via ORAL
  Filled 2022-04-10: qty 1

## 2022-04-10 MED ORDER — DIBUCAINE (PERIANAL) 1 % EX OINT: 1.0000 | TOPICAL_OINTMENT | CUTANEOUS | Status: DC | PRN

## 2022-04-10 MED ORDER — LACTATED RINGERS IV SOLN: INTRAVENOUS | Status: DC

## 2022-04-10 MED ORDER — DIPHENHYDRAMINE HCL 25 MG PO CAPS: 25.0000 mg | ORAL_CAPSULE | Freq: Four times a day (QID) | ORAL | Status: DC | PRN

## 2022-04-10 MED ORDER — SENNOSIDES-DOCUSATE SODIUM 8.6-50 MG PO TABS
2.0000 | ORAL_TABLET | ORAL | Status: DC
  Administered 2022-04-11: 1 via ORAL
  Filled 2022-04-10: qty 2

## 2022-04-10 MED ORDER — FLEET ENEMA 7-19 GM/118ML RE ENEM: 1.0000 | ENEMA | Freq: Every day | RECTAL | Status: DC | PRN

## 2022-04-10 MED ORDER — FENTANYL-BUPIVACAINE-NACL 0.5-0.125-0.9 MG/250ML-% EP SOLN
12.0000 mL/h | EPIDURAL | Status: DC | PRN
  Filled 2022-04-10: qty 250

## 2022-04-10 MED ORDER — WITCH HAZEL-GLYCERIN EX PADS: 1.0000 | MEDICATED_PAD | CUTANEOUS | Status: DC | PRN

## 2022-04-10 MED ORDER — ONDANSETRON HCL 4 MG/2ML IJ SOLN: 4.0000 mg | INTRAMUSCULAR | Status: DC | PRN

## 2022-04-10 MED ORDER — ZOLPIDEM TARTRATE 5 MG PO TABS: 5.0000 mg | ORAL_TABLET | Freq: Every evening | ORAL | Status: DC | PRN

## 2022-04-10 MED ORDER — ACETAMINOPHEN 325 MG PO TABS: 650.0000 mg | ORAL_TABLET | ORAL | Status: DC | PRN

## 2022-04-10 MED ORDER — BENZOCAINE-MENTHOL 20-0.5 % EX AERO
1.0000 | INHALATION_SPRAY | CUTANEOUS | Status: DC | PRN
  Administered 2022-04-10: 1 via TOPICAL
  Filled 2022-04-10: qty 56

## 2022-04-10 MED ORDER — ONDANSETRON HCL 4 MG/2ML IJ SOLN: 4.0000 mg | Freq: Four times a day (QID) | INTRAMUSCULAR | Status: DC | PRN

## 2022-04-10 MED ORDER — ACETAMINOPHEN 500 MG PO TABS
1000.0000 mg | ORAL_TABLET | Freq: Four times a day (QID) | ORAL | Status: DC
  Administered 2022-04-10 – 2022-04-11 (×4): 1000 mg via ORAL
  Filled 2022-04-10 (×4): qty 2

## 2022-04-10 MED ORDER — TERBUTALINE SULFATE 1 MG/ML IJ SOLN: 0.2500 mg | Freq: Once | INTRAMUSCULAR | Status: DC | PRN

## 2022-04-10 MED ORDER — LACTATED RINGERS IV SOLN: 500.0000 mL | Freq: Once | INTRAVENOUS | Status: DC

## 2022-04-10 MED ORDER — SOD CITRATE-CITRIC ACID 500-334 MG/5ML PO SOLN: 30.0000 mL | ORAL | Status: DC | PRN

## 2022-04-10 MED ORDER — ONDANSETRON HCL 4 MG PO TABS: 4.0000 mg | ORAL_TABLET | ORAL | Status: DC | PRN

## 2022-04-10 MED ORDER — COCONUT OIL OIL: 1.0000 | TOPICAL_OIL | Status: DC | PRN

## 2022-04-10 MED ORDER — LACTATED RINGERS IV SOLN: 500.0000 mL | INTRAVENOUS | Status: DC | PRN

## 2022-04-10 MED ORDER — OXYTOCIN-SODIUM CHLORIDE 30-0.9 UT/500ML-% IV SOLN
1.0000 m[IU]/min | INTRAVENOUS | Status: DC
  Administered 2022-04-10: 2 m[IU]/min via INTRAVENOUS

## 2022-04-10 MED ORDER — MISOPROSTOL 25 MCG QUARTER TABLET: 25.0000 ug | ORAL_TABLET | ORAL | Status: DC | PRN

## 2022-04-10 MED ORDER — PHENYLEPHRINE 80 MCG/ML (10ML) SYRINGE FOR IV PUSH (FOR BLOOD PRESSURE SUPPORT)
80.0000 ug | PREFILLED_SYRINGE | INTRAVENOUS | Status: DC | PRN
  Administered 2022-04-10 (×2): 80 ug via INTRAVENOUS

## 2022-04-10 MED ORDER — PHENYLEPHRINE 80 MCG/ML (10ML) SYRINGE FOR IV PUSH (FOR BLOOD PRESSURE SUPPORT)
80.0000 ug | PREFILLED_SYRINGE | INTRAVENOUS | Status: DC | PRN
  Filled 2022-04-10: qty 10

## 2022-04-10 MED ORDER — LIDOCAINE HCL (PF) 1 % IJ SOLN
INTRAMUSCULAR | Status: DC | PRN
  Administered 2022-04-10: 5 mL via EPIDURAL

## 2022-04-10 MED ORDER — BISACODYL 10 MG RE SUPP: 10.0000 mg | Freq: Every day | RECTAL | Status: DC | PRN

## 2022-04-10 NOTE — Lactation Note (Signed)
This note was copied from a baby's chart. Lactation Consultation Note  Patient Name: Maureen Carlson TIRWE'R Date: 04/10/2022   Age:33 hours Mom is going to call for [facility] for next feeding.  Maternal Data    Feeding    LATCH Score                    Lactation Tools Discussed/Used    Interventions    Discharge    Consult Status      Theodoro Kalata 04/10/2022, 11:04 PM

## 2022-04-10 NOTE — Progress Notes (Signed)
ELYNORE DOLINSKI is a 32 y.o. G3P2002 at [redacted]w[redacted]d by LMP admitted for induction of labor due to [place] with poly.  Subjective: Comfortable  Objective: BP 127/81   Pulse 81   Temp 98.7 F (37.1 C) (Oral)   Resp 18   Ht 5\' 6"  (1.676 m)   Wt 86.2 kg   SpO2 100%   BMI 30.67 kg/m  No intake/output data recorded. Total I/O In: -  Out: 1000 [Urine:1000]  FHT:  FHR: 135 bpm, variability: moderate,  accelerations:  Present,  decelerations:  Present variable decelerations UC:   regular, every 2-3 minutes SVE:   8/100/0 Unable to reduce IUPC place without difficulty for amnioinfusion  Labs: Lab Results  Component Value Date   WBC 8.7 04/10/2022   HGB 12.5 04/10/2022   HCT 38.6 04/10/2022   MCV 86.5 04/10/2022   PLT 185 04/10/2022    Assessment / Plan: Induction of labor due to LGA with poly,  progressing well on pitocin  Labor: Progressing normally Preeclampsia:  no signs or symptoms of toxicity Fetal Wellbeing:  Category II- Amnioinfusion placed , will reassess in 30 min. Pain Control:  Epidural I/D:  n/a Anticipated MOD:  NSVD  Lovenia Kim, MD 04/10/2022, 12:26 PM

## 2022-04-10 NOTE — Anesthesia Procedure Notes (Signed)
Epidural Patient location during procedure: OB Start time: 04/10/2022 8:05 AM End time: 04/10/2022 8:17 AM  Staffing Anesthesiologist: Barnet Glasgow, MD Performed: anesthesiologist   Preanesthetic Checklist Completed: patient identified, IV checked, site marked, risks and benefits discussed, surgical consent, monitors and equipment checked, pre-op evaluation and timeout performed  Epidural Patient position: sitting Prep: DuraPrep and site prepped and draped Patient monitoring: continuous pulse ox and blood pressure Approach: midline Location: L3-L4 Injection technique: LOR air  Needle:  Needle type: Tuohy  Needle gauge: 17 G Needle length: 9 cm and 9 Needle insertion depth: 7 cm Catheter type: closed end flexible Catheter size: 19 Gauge Catheter at skin depth: 12 cm Test dose: negative  Assessment Events: blood not aspirated, no cerebrospinal fluid, injection not painful, no injection resistance, no paresthesia and negative IV test  Additional Notes Patient identified. Risks/Benefits/Options discussed with patient including but not limited to bleeding, infection, nerve damage, paralysis, failed block, incomplete pain control, headache, blood pressure changes, nausea, vomiting, reactions to medication both or allergic, itching and postpartum back pain. Confirmed with bedside nurse the patient's most recent platelet count. Confirmed with patient that they are not currently taking any anticoagulation, have any bleeding history or any family history of bleeding disorders. Patient expressed understanding and wished to proceed. All questions were answered. Sterile technique was used throughout the entire procedure. Please see nursing notes for vital signs. Test dose was given through epidural needle and negative prior to continuing to dose epidural or start infusion. Warning signs of high block given to the patient including shortness of breath, tingling/numbness in hands, complete  motor block, or any concerning symptoms with instructions to call for help. Patient was given instructions on fall risk and not to get out of bed. All questions and concerns addressed with instructions to call with any issues.  1 Attempt (S) . Patient tolerated procedure well.

## 2022-04-10 NOTE — H&P (Signed)
Maureen Carlson is a 32 y.o. female presenting for IOL for EFW > 90th percentile and mild poly. Previous pregnancy with history of IUGR OB History     Gravida  3   Para  2   Term  2   Preterm  0   AB  0   Living  2      SAB  0   IAB  0   Ectopic  0   Multiple  0   Live Births  2          Past Medical History:  Diagnosis Date   Gestational diabetes    Headache    Menorrhagia    Past Surgical History:  Procedure Laterality Date   ARTHROSCOPIC REPAIR ACL     TONSILLECTOMY     TONSILLECTOMY     Family History: family history includes Anemia in her mother; Anxiety disorder in her mother, paternal grandfather, and paternal uncle; Asthma in her brother; Cancer in her paternal grandfather; Heart attack in her mother; Hypertension in her father; Multiple births in her maternal grandmother; Pyelonephritis in her mother; Thyroid disease in her maternal grandmother and mother. Social History:  reports that she has never smoked. She has never used smokeless tobacco. She reports that she does not currently use alcohol after a past usage of about 1.0 - 2.0 standard drink of alcohol per week. She reports that she does not use drugs.     Maternal Diabetes: No Genetic Screening: Normal Maternal Ultrasounds/Referrals: Normal Fetal Ultrasounds or other Referrals:  None Maternal Substance Abuse:  No Significant Maternal Medications:  None Significant Maternal Lab Results:  Group B Strep negative Number of Prenatal Visits:greater than 3 verified prenatal visits Other Comments:  None  Review of Systems  Constitutional: Negative.   All other systems reviewed and are negative.  Maternal Medical History:  Reason for admission: Contractions.   Contractions: Onset was less than 1 hour ago.   Frequency: rare.   Perceived severity is mild.   Fetal activity: Perceived fetal activity is normal.   Last perceived fetal movement was within the past hour.   Prenatal complications:  Polyhydramnios.   Prenatal Complications - Diabetes: none.     currently breastfeeding. Maternal Exam:  Uterine Assessment: Contraction strength is mild.  Contraction frequency is rare.  Abdomen: Patient reports no abdominal tenderness. Fetal presentation: vertex Introitus: Normal vulva. Normal vagina.  Ferning test: not done.  Nitrazine test: not done. Amniotic fluid character: not assessed. Pelvis: adequate for delivery.   Cervix: Cervix evaluated by digital exam.     Physical Exam Vitals and nursing note reviewed. Exam conducted with a chaperone present.  Constitutional:      Appearance: Normal appearance. She is normal weight.  HENT:     Head: Normocephalic and atraumatic.  Cardiovascular:     Rate and Rhythm: Normal rate and regular rhythm.     Pulses: Normal pulses.     Heart sounds: Normal heart sounds.  Pulmonary:     Effort: Pulmonary effort is normal.     Breath sounds: Normal breath sounds.  Abdominal:     General: Bowel sounds are normal.     Palpations: Abdomen is soft.  Genitourinary:    General: Normal vulva.  Musculoskeletal:        General: Normal range of motion.     Cervical back: Normal range of motion and neck supple.  Skin:    General: Skin is warm and dry.  Neurological:  General: No focal deficit present.     Mental Status: She is alert and oriented to person, place, and time.  Psychiatric:        Mood and Affect: Mood normal.        Behavior: Behavior normal.     Prenatal labs: ABO, Rh:   Antibody:   Rubella:   RPR:    HBsAg:    HIV:    GBS: Negative/-- (12/28 0000)   Assessment/Plan: 39+ week IUP History of IUGR in previous pregnancy LGA with mild poly- ? CHO intol with one abnormal value on GTT IOL   Alonzo Owczarzak J 04/10/2022, 6:40 AM

## 2022-04-10 NOTE — Progress Notes (Signed)
ANJELA CASSARA is a 32 y.o. G3P2002 at [redacted]w[redacted]d by LMP admitted for induction of labor due to [place] with poly.  Subjective: Comfortable  Objective: BP 119/65   Pulse 79   Temp 98.8 F (37.1 C) (Oral)   Resp 16   Ht 5\' 6"  (1.676 m)   Wt 86.2 kg   SpO2 100%   BMI 30.67 kg/m  No intake/output data recorded. Total I/O In: -  Out: 1000 [Urine:1000]  FHT:  FHR: 135 bpm, variability: moderate,  accelerations:  Present,  decelerations:  Present variable decelerations - variables improved UC:   regular, every 2-3 minutes SVE:   8/100/0 Amnioinfusion running  Labs: Lab Results  Component Value Date   WBC 8.7 04/10/2022   HGB 12.5 04/10/2022   HCT 38.6 04/10/2022   MCV 86.5 04/10/2022   PLT 185 04/10/2022    Assessment / Plan: Induction of labor due to LGA with poly,  progressing well on pitocin  Labor: Progressing normally Preeclampsia:  no signs or symptoms of toxicity Fetal Wellbeing: Category 1 -2, excellent BTBV, accels noted. Variables mild now. Pain Control:  Epidural I/D:  n/a Anticipated MOD:  NSVD  Lovenia Kim, MD 04/10/2022, 1:45 PM

## 2022-04-10 NOTE — Anesthesia Preprocedure Evaluation (Addendum)
Anesthesia Evaluation  Patient identified by MRN, date of birth, ID band Patient awake    Reviewed: Allergy & Precautions, NPO status , Patient's Chart, lab work & pertinent test results  Airway Mallampati: II  TM Distance: >3 FB Neck ROM: Full    Dental no notable dental hx. (+) Teeth Intact, Dental Advisory Given   Pulmonary neg pulmonary ROS   Pulmonary exam normal breath sounds clear to auscultation       Cardiovascular Exercise Tolerance: Good Normal cardiovascular exam Rhythm:Regular Rate:Normal     Neuro/Psych  Headaches    GI/Hepatic negative GI ROS, Neg liver ROS,,,  Endo/Other    Renal/GU      Musculoskeletal   Abdominal   Peds  Hematology Lab Results      Component                Value               Date                      WBC                      8.7                 04/10/2022                HGB                      12.5                04/10/2022                HCT                      38.6                04/10/2022                MCV                      86.5                04/10/2022                PLT                      185                 04/10/2022              Anesthesia Other Findings   Reproductive/Obstetrics (+) Pregnancy                             Anesthesia Physical Anesthesia Plan  ASA: 2  Anesthesia Plan: Epidural   Post-op Pain Management:    Induction:   PONV Risk Score and Plan:   Airway Management Planned:   Additional Equipment: None  Intra-op Plan:   Post-operative Plan:   Informed Consent: I have reviewed the patients History and Physical, chart, labs and discussed the procedure including the risks, benefits and alternatives for the proposed anesthesia with the patient or authorized representative who has indicated his/her understanding and acceptance.     Dental advisory given  Plan Discussed with: CRNA  Anesthesia Plan  Comments: (39.2 wk G3P2  for LEA)  Anesthesia Quick Evaluation  

## 2022-04-11 LAB — CBC
HCT: 30.5 % — ABNORMAL LOW (ref 36.0–46.0)
Hemoglobin: 10.3 g/dL — ABNORMAL LOW (ref 12.0–15.0)
MCH: 28.5 pg (ref 26.0–34.0)
MCHC: 33.8 g/dL (ref 30.0–36.0)
MCV: 84.5 fL (ref 80.0–100.0)
Platelets: 128 10*3/uL — ABNORMAL LOW (ref 150–400)
RBC: 3.61 MIL/uL — ABNORMAL LOW (ref 3.87–5.11)
RDW: 12.8 % (ref 11.5–15.5)
WBC: 12.3 10*3/uL — ABNORMAL HIGH (ref 4.0–10.5)
nRBC: 0 % (ref 0.0–0.2)

## 2022-04-11 MED ORDER — IBUPROFEN 600 MG PO TABS: 600.0000 mg | ORAL_TABLET | Freq: Four times a day (QID) | ORAL | 0 refills | Status: DC

## 2022-04-11 NOTE — Lactation Note (Signed)
This note was copied from a baby's chart. Lactation Consultation Note  Patient Name: Maureen Carlson JOINO'M Date: 04/11/2022 Reason for consult: Initial assessment;Term Age:32 hours Mom will call for assistance as needed. Mom is currently BF her 34 month old morning and night. Mom asked questions about tandem feeding. LC discussed w/mom. Newborn feeding habits, STS, I&O, reviewed. Mom encouraged to feed baby 8-12 times/24 hours and with feeding cues.  Baby currently has no interest in BF at this time. LC clear mouth of foamy mucous w/bulb syringe. FOB stated baby had a spit up earlier. Encouraged mom to call for assistance as needed. Maternal Data Has patient been taught Hand Expression?: Yes Does the patient have breastfeeding experience prior to this delivery?: Yes How long did the patient breastfeed?: 8 months to her now 32 yr old and still BF her 34 month old. Mom will tandum feed morning and night.  Feeding    LATCH Score Latch: Too sleepy or reluctant, no latch achieved, no sucking elicited.  Audible Swallowing: None  Type of Nipple: Everted at rest and after stimulation  Comfort (Breast/Nipple): Soft / non-tender  Hold (Positioning): No assistance needed to correctly position infant at breast.  LATCH Score: 6   Lactation Tools Discussed/Used    Interventions Interventions: Breast feeding basics reviewed;LC Services brochure  Discharge    Consult Status Consult Status: PRN Date: 04/11/22 Follow-up type: In-patient    Theodoro Kalata 04/11/2022, 12:48 AM

## 2022-04-11 NOTE — Discharge Summary (Signed)
Postpartum Discharge Summary  Date of Service updated     Patient Name: Maureen Carlson DOB: 1991/01/30 MRN: [id number]  Date of admission: 04/10/2022 Delivery date:04/10/2022  Delivering provider: Brien Few  Date of discharge: 04/11/2022  Admitting diagnosis: Encounter for induction of labor [Z34.90] Intrauterine pregnancy: [redacted]w[redacted]d     Secondary diagnosis:  Principal Problem:   Postpartum care following vaginal delivery 1/25 Active Problems:   Encounter for induction of labor  Additional problems: none    Discharge diagnosis: Term Pregnancy [facility] course: Induction of Labor With Vaginal Delivery   32 y.o. yo G3P3003 at [redacted]w[redacted]d was admitted to the hospital 04/10/2022 for induction of labor.  Indication for induction: Elective.   Membrane Rupture Time/Date: 8:35 AM ,04/10/2022   Delivery Method:Vaginal, Spontaneous  Episiotomy: None  Lacerations:  None  Details of delivery can be found in separate delivery note.  Patient is discharged home 04/11/22.  Newborn Data: Birth date:04/10/2022  Birth time:2:37 PM  Gender:Female  Living status:Living  Apgars:9 ,9  Weight:3714 g    Physical exam  Vitals:   04/10/22 2100 04/11/22 0015 04/11/22 0430 04/11/22 0959  BP: 120/84 111/69 117/85 128/84  Pulse: 72 75 72 80  Resp: 16 16 16    Temp: 99.2 F (37.3 C) 97.6 F (36.4 C) 98.3 F (36.8 C)   TempSrc: Oral Oral Oral   SpO2: 100% 99% 100% 98%  Weight:      Height:       General: alert, cooperative, and no distress Lochia: appropriate Uterine Fundus: firm Incision: Healing well with no significant drainage DVT Evaluation: No evidence of DVT seen on physical exam. Labs: Lab Results  Component Value Date   WBC 12.3 (H) 04/11/2022   HGB 10.3 (L) 04/11/2022   HCT 30.5 (L) 04/11/2022   MCV 84.5 04/11/2022   PLT 128 (L) 04/11/2022       No data to display         Edinburgh Score:    04/10/2022    4:28 PM   Edinburgh Postnatal Depression Scale Screening Tool  I have been able to laugh and see the funny side of things. 0  I have looked forward with enjoyment to things. 0  I have blamed myself unnecessarily when things went wrong. 0  I have been anxious or worried for no good reason. 0  I have felt scared or panicky for no good reason. 0  Things have been getting on top of me. 0  I have been so unhappy that I have had difficulty sleeping. 0  I have felt sad or miserable. 0  I have been so unhappy that I have been crying. 0  The thought of harming myself has occurred to me. 0  Edinburgh Postnatal Depression Scale Total 0      After visit meds:  Allergies as of 04/11/2022   No Known Allergies      Medication List     TAKE these medications    ibuprofen 600 MG tablet Commonly known as: ADVIL Take 1 tablet (600 mg total) by mouth every 6 (six) hours.   prenatal multivitamin Tabs tablet Take 1 tablet by mouth daily at 12 noon.  Discharge home in stable condition Postpartum Appointment:6 weeks Future Appointments:No future appointments. Follow up Visit:      04/11/2022 Charyl Bigger, MD

## 2022-04-11 NOTE — Lactation Note (Signed)
This note was copied from a baby's chart. Lactation Consultation Note  Patient Name: Maureen Carlson HMCNO'B Date: 04/11/2022 Reason for consult: Follow-up assessment;Mother's request;Term Age:32 hours  LC visit at mother's request. Baby had his circumcision earlier and has been sleepy. Assisted mother in a cross cradle hold so she can achieve a deeper latch with baby "Maureen Carlson" opposed to the cradle hold. Mother is able to easily express colostrum prior to latch and audible swallows were heard during the feeding especially with alternate breast massage. Instructed to observe infant for cues, discussed anticipated cluster feeding and goal of 8/12 feedings in 24 hours.  Mother has OP [place] handout with services and resources. Mother is hopeful that she and baby will be discharged home today.   LATCH Score Latch: Grasps breast easily, tongue down, lips flanged, rhythmical sucking.  Audible Swallowing: Spontaneous and intermittent  Type of Nipple: Everted at rest and after stimulation  Comfort (Breast/Nipple): Soft / non-tender  Hold (Positioning): Assistance needed to correctly position infant at breast and maintain latch.  LATCH Score: 9     Interventions Interventions: Breast feeding basics reviewed;Assisted with latch;Skin to skin;Breast compression;Breast massage;Adjust position;Support pillows;Education  Discharge Discharge Education: Warning signs for feeding baby  Consult Status Consult Status: Follow-up Date: 04/12/22 Follow-up type: In-patient If not discharged   Gwenevere Abbot 04/11/2022, 11:53 AM

## 2022-04-11 NOTE — Progress Notes (Signed)
No c/o; pain controlled, nml lochia; tol po, ambulating Voids w/o difficulty  Patient Vitals for the past 24 hrs:  BP Temp Temp src Pulse Resp SpO2  04/11/22 0959 128/84 -- -- 80 -- 98 %  04/11/22 0430 117/85 98.3 F (36.8 C) Oral 72 16 100 %  04/11/22 0015 111/69 97.6 F (36.4 C) Oral 75 16 99 %  04/10/22 2100 120/84 99.2 F (37.3 C) Oral 72 16 100 %  04/10/22 1730 131/86 98.6 F (37 C) Oral 82 18 --  04/10/22 1628 138/84 99.5 F (37.5 C) Oral 88 18 100 %  04/10/22 1546 120/77 -- -- 81 16 --  04/10/22 1531 115/69 -- -- 88 16 --  04/10/22 1516 120/62 98.9 F (37.2 C) Oral 88 18 --  04/10/22 1500 112/66 -- -- 89 16 --  04/10/22 1453 (!) 90/43 -- -- 95 18 --  04/10/22 1431 136/81 -- -- 98 16 --  04/10/22 1401 (!) 109/58 -- -- 76 18 --  04/10/22 1331 119/65 -- -- 79 16 --  04/10/22 1301 127/84 98.8 F (37.1 C) Oral 88 18 --  04/10/22 1231 124/67 -- -- 87 -- --   A&ox3 Nml respirations Abd: soft,nt; fundus firm and below umb LE: no edema, nt bilat     Latest Ref Rng & Units 04/11/2022    6:08 AM 04/10/2022    7:06 AM 10/15/2020    4:41 AM  CBC  WBC 4.0 - 10.5 K/uL 12.3  8.7  14.4   Hemoglobin 12.0 - 15.0 g/dL 10.3  12.5  11.0   Hematocrit 36.0 - 46.0 % 30.5  38.6  32.4   Platelets 150 - 400 K/uL 128  185  141    A/P: ppd1 s/p svd Doing well, contin care; plan d/c home today Abl anemia - asymptomatic, iron pp Rh pos RI Thrombocytopenia - likely d/t blood loss, anticipate improvement; f/u pp

## 2022-04-11 NOTE — Anesthesia Postprocedure Evaluation (Signed)
Anesthesia Post Note  Patient: Maureen Carlson  Procedure(s) Performed: AN AD Worden     Patient location during evaluation: Mother Baby Anesthesia Type: Epidural Level of consciousness: awake and alert and oriented Pain management: satisfactory to patient Vital Signs Assessment: post-procedure vital signs reviewed and stable Respiratory status: respiratory function stable Cardiovascular status: stable Postop Assessment: no headache, no backache, epidural receding, patient able to bend at knees, no signs of nausea or vomiting, adequate PO intake and able to ambulate Anesthetic complications: no   No notable events documented.  Last Vitals:  Vitals:   04/11/22 0430 04/11/22 0959  BP: 117/85 128/84  Pulse: 72 80  Resp: 16   Temp: 36.8 C   SpO2: 100% 98%    Last Pain:  Vitals:   04/11/22 1246  TempSrc:   PainSc: 3    Pain Goal:                   Yaret Hush

## 2022-04-17 DIAGNOSIS — Z419 Encounter for procedure for purposes other than remedying health state, unspecified: Secondary | ICD-10-CM | POA: Diagnosis not present

## 2022-04-19 ENCOUNTER — Telehealth (HOSPITAL_COMMUNITY): Payer: Self-pay | Admitting: *Deleted

## 2022-04-19 NOTE — Telephone Encounter (Signed)
Mom reports feeling good. No concerns about herself at this time. EPDS=0 [facility] score=0) Mom reports baby is doing well. Feeding, peeing, and pooping without difficulty. Safe sleep reviewed. Mom reports no concerns about baby at present.  Odis Hollingshead, RN 04-19-2022 at 9:51am

## 2022-05-16 DIAGNOSIS — Z419 Encounter for procedure for purposes other than remedying health state, unspecified: Secondary | ICD-10-CM | POA: Diagnosis not present

## 2022-05-22 DIAGNOSIS — Z124 Encounter for screening for malignant neoplasm of cervix: Secondary | ICD-10-CM | POA: Diagnosis not present

## 2022-05-22 DIAGNOSIS — Z30430 Encounter for insertion of intrauterine contraceptive device: Secondary | ICD-10-CM | POA: Diagnosis not present

## 2022-06-16 DIAGNOSIS — Z419 Encounter for procedure for purposes other than remedying health state, unspecified: Secondary | ICD-10-CM | POA: Diagnosis not present

## 2022-06-26 DIAGNOSIS — N939 Abnormal uterine and vaginal bleeding, unspecified: Secondary | ICD-10-CM | POA: Diagnosis not present

## 2022-07-16 DIAGNOSIS — Z419 Encounter for procedure for purposes other than remedying health state, unspecified: Secondary | ICD-10-CM | POA: Diagnosis not present

## 2022-08-16 DIAGNOSIS — Z419 Encounter for procedure for purposes other than remedying health state, unspecified: Secondary | ICD-10-CM | POA: Diagnosis not present

## 2022-09-15 DIAGNOSIS — Z419 Encounter for procedure for purposes other than remedying health state, unspecified: Secondary | ICD-10-CM | POA: Diagnosis not present

## 2022-10-16 DIAGNOSIS — Z419 Encounter for procedure for purposes other than remedying health state, unspecified: Secondary | ICD-10-CM | POA: Diagnosis not present

## 2022-11-16 DIAGNOSIS — Z419 Encounter for procedure for purposes other than remedying health state, unspecified: Secondary | ICD-10-CM | POA: Diagnosis not present

## 2022-12-16 DIAGNOSIS — Z419 Encounter for procedure for purposes other than remedying health state, unspecified: Secondary | ICD-10-CM | POA: Diagnosis not present

## 2023-01-16 DIAGNOSIS — Z419 Encounter for procedure for purposes other than remedying health state, unspecified: Secondary | ICD-10-CM | POA: Diagnosis not present

## 2023-02-15 DIAGNOSIS — Z419 Encounter for procedure for purposes other than remedying health state, unspecified: Secondary | ICD-10-CM | POA: Diagnosis not present

## 2023-03-18 DIAGNOSIS — Z419 Encounter for procedure for purposes other than remedying health state, unspecified: Secondary | ICD-10-CM | POA: Diagnosis not present

## 2023-04-18 DIAGNOSIS — Z419 Encounter for procedure for purposes other than remedying health state, unspecified: Secondary | ICD-10-CM | POA: Diagnosis not present

## 2023-05-16 DIAGNOSIS — Z419 Encounter for procedure for purposes other than remedying health state, unspecified: Secondary | ICD-10-CM | POA: Diagnosis not present

## 2023-06-27 DIAGNOSIS — Z419 Encounter for procedure for purposes other than remedying health state, unspecified: Secondary | ICD-10-CM | POA: Diagnosis not present

## 2023-07-27 DIAGNOSIS — Z419 Encounter for procedure for purposes other than remedying health state, unspecified: Secondary | ICD-10-CM | POA: Diagnosis not present

## 2023-08-17 IMAGING — DX DG KNEE 3 VIEWS*L*
3 series · 3 of 3 positions shown · non-contrast
Comparison: None.

CLINICAL DATA: Left knee pain for 2 weeks, no known injury, initial
encounter

EXAM:
LEFT KNEE - 3 VIEW

[knee ap]
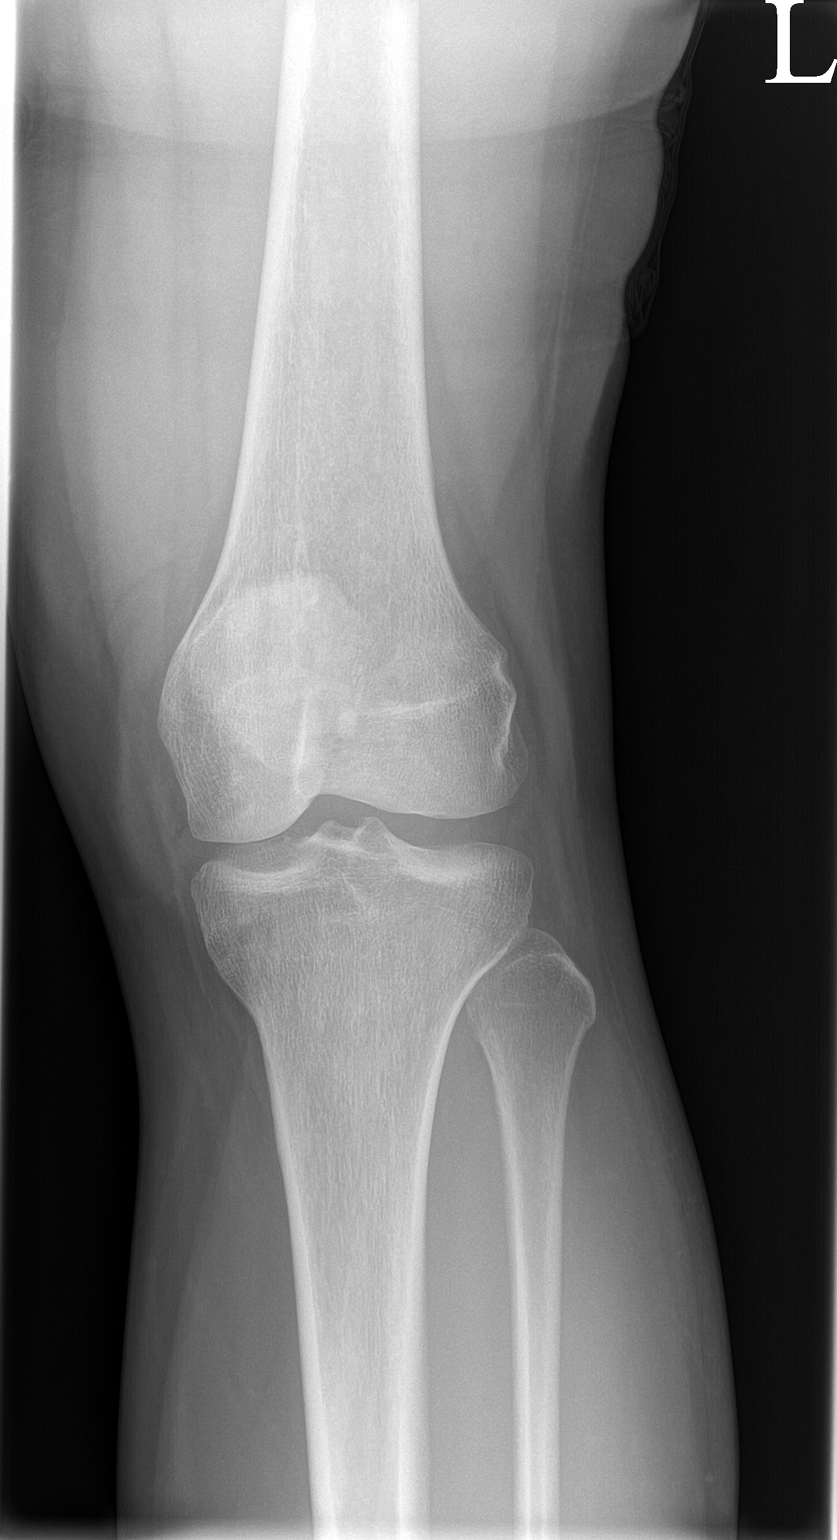

[knee lat]
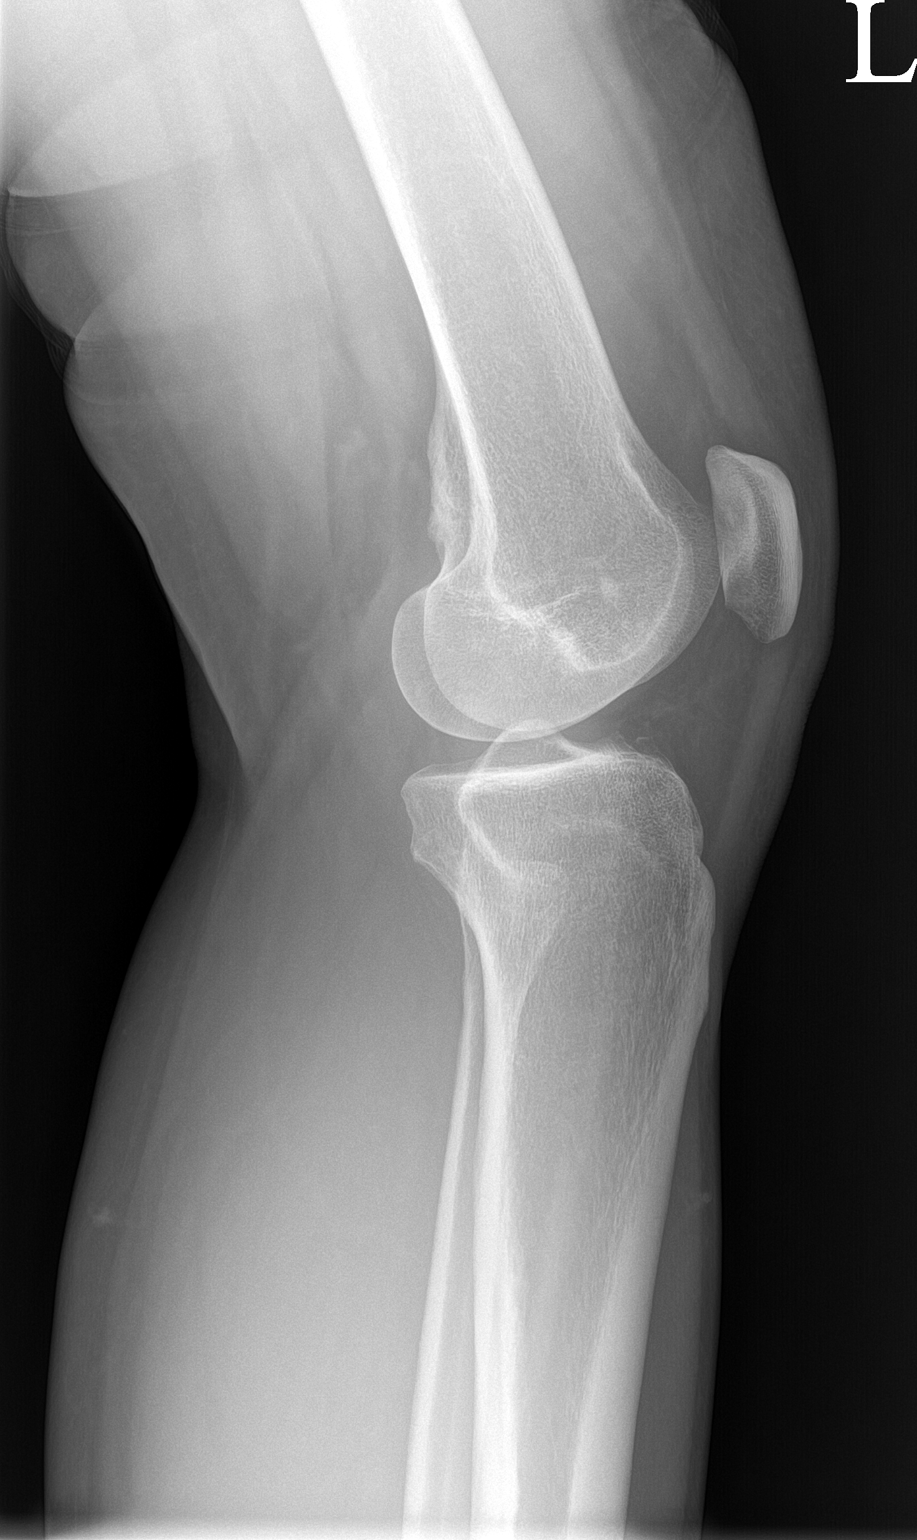

[patella]
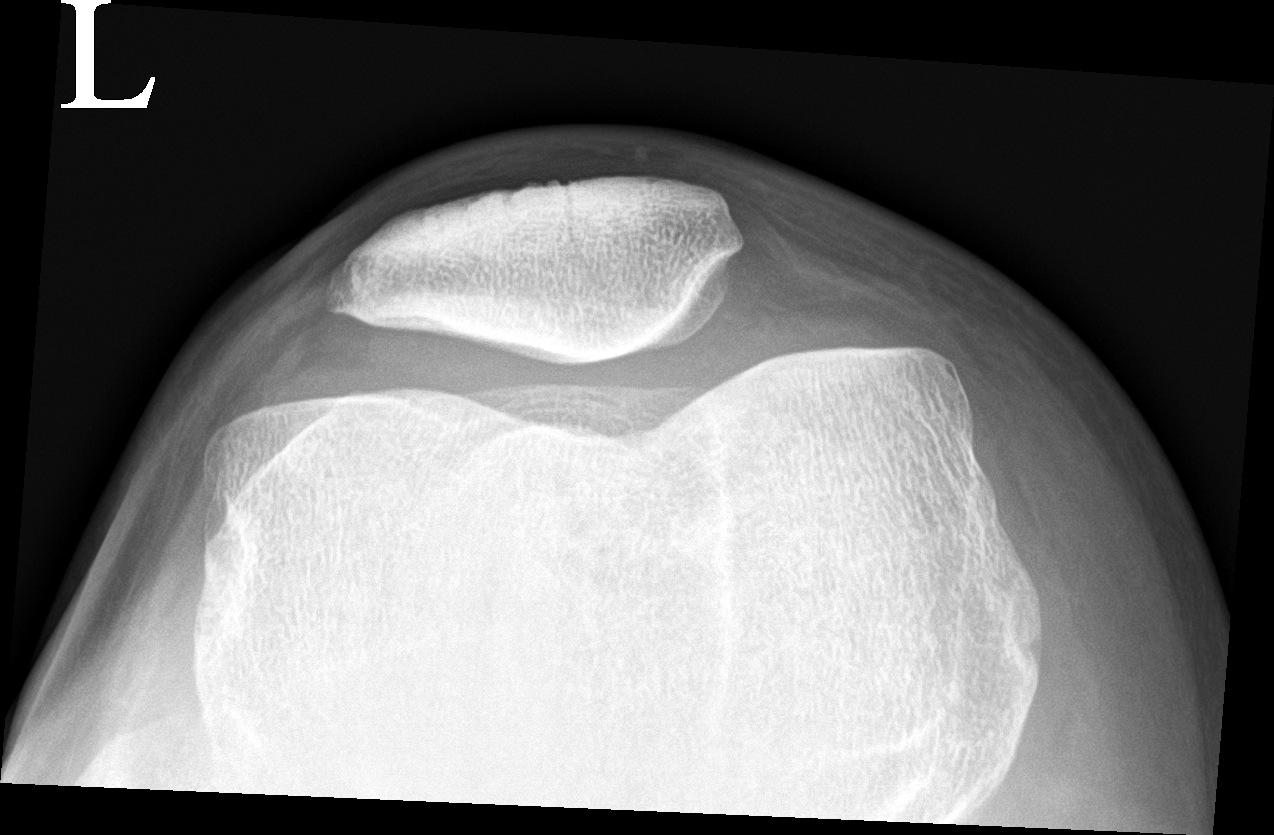

[3 of 3 positions shown; findings below may reference images not displayed]

FINDINGS: No evidence of fracture, dislocation, or joint effusion. No evidence
of arthropathy or other focal bone abnormality. Soft tissues are
unremarkable.
IMPRESSION: No acute abnormality noted.

## 2023-08-27 DIAGNOSIS — Z419 Encounter for procedure for purposes other than remedying health state, unspecified: Secondary | ICD-10-CM | POA: Diagnosis not present

## 2023-09-26 DIAGNOSIS — Z419 Encounter for procedure for purposes other than remedying health state, unspecified: Secondary | ICD-10-CM | POA: Diagnosis not present

## 2023-10-27 DIAGNOSIS — Z419 Encounter for procedure for purposes other than remedying health state, unspecified: Secondary | ICD-10-CM | POA: Diagnosis not present

## 2023-11-09 ENCOUNTER — Encounter: Admitting: Obstetrics & Gynecology

## 2023-11-23 ENCOUNTER — Encounter: Payer: Self-pay | Admitting: Obstetrics & Gynecology

## 2023-11-23 ENCOUNTER — Ambulatory Visit: Admitting: Obstetrics & Gynecology

## 2023-11-23 VITALS — BP 132/81 | HR 76 | Ht 65.0 in | Wt 155.0 lb

## 2023-11-23 DIAGNOSIS — Z01419 Encounter for gynecological examination (general) (routine) without abnormal findings: Secondary | ICD-10-CM | POA: Diagnosis not present

## 2023-11-23 DIAGNOSIS — Z8759 Personal history of other complications of pregnancy, childbirth and the puerperium: Secondary | ICD-10-CM | POA: Diagnosis not present

## 2023-11-23 NOTE — Progress Notes (Signed)
 Subjective:     Maureen Carlson is a 33 y.o. female here for a routine exam.  Current complaints: wants IUD removed. Wants to start tracking menses and plan 4th pregnancy   Gynecologic History No LMP recorded. Contraception: IUD Last Pap: 2024. Results were: normal (have records from [facility]) Last mammogram: never; low risk / no family hx  Obstetric History OB History  Gravida Para Term Preterm AB Living  3 3 3  0 0 3  SAB IAB Ectopic Multiple Live Births  0 0 0 0 3    # Outcome Date GA Lbr Len/2nd Weight Sex Type Anes PTL Lv  3 Term 04/10/22 [redacted]w[redacted]d 05:55 / 00:07 8 lb 3 oz (3.714 kg) M Vag-Spont EPI  LIV     Birth Comments: facial bruising  2 Term 10/14/20 [redacted]w[redacted]d 06:32 / 00:18 7 lb 15.2 oz (3.605 kg) F Vag-Spont EPI  LIV  1 Term 12/24/16 [redacted]w[redacted]d 03:35 / 01:17 5 lb 12.6 oz (2.625 kg) M Vag-Spont EPI  LIV     Birth Comments: WNL      The following portions of the patient's history were reviewed and updated as appropriate: allergies, current medications, past family history, past medical history, past social history, past surgical history, and problem list.  Review of Systems Pertinent items noted in HPI and remainder of comprehensive ROS otherwise negative.    Objective:   Vitals:   11/23/23 1124  BP: 132/81  Pulse: 76  Weight: 155 lb (70.3 kg)  Height: 5' 5 (1.651 m)    Vitals:  WNL General appearance: alert, cooperative and no distress  HEENT: Normocephalic, without obvious abnormality, atraumatic Eyes: negative Throat: lips, mucosa, and tongue normal; teeth and gums normal  Respiratory: Clear to auscultation bilaterally  CV: Regular rate and rhythm  Breasts:  Normal appearance, no masses or tenderness, no nipple retraction or dimpling  GI: Soft, non-tender; bowel sounds normal; no masses,  no organomegaly  GU: External Genitalia:  Tanner V, no lesion Urethra:  No prolapse   Vagina: Pink, normal rugae, no blood or discharge  Cervix: No CMT, no lesion, IUD strings  present  Uterus:  Normal size and contour, non tender  Adnexa: Normal, no masses, non tender  Musculoskeletal: No edema, redness or tenderness in the calves or thighs  Skin: No lesions or rash  Lymphatic: Axillary adenopathy: none     Psychiatric: Normal mood and behavior        Assessment:    Healthy female exam.    Plan:   Pap last year--co testing is negative.  Desires IUD removal Start PNV in case pregnancy occurs.   IUD Removal Patient identified, informed consent performed. Discussed risks of irregular bleeding, cramping, infection, malpositioning or misplacement of the IUD outside the uterus which may require further procedures. Time out was performed. Speculum placed in the vagina. Cervix visualized. The strings of the IUD were grasped and pulled using ring forceps. The IUD was successfully removed in its entirety.

## 2023-11-23 NOTE — Progress Notes (Signed)
 Last pap smear (date and result):2024 [facility], reported normal. Last mammogram (date and result):n/a Last colon screening (date and result):n/a Brush:yes Floss:yes Seatbelts: yes Sunscreen: yes   Silvano LELON Piano, RN

## 2023-11-27 DIAGNOSIS — Z419 Encounter for procedure for purposes other than remedying health state, unspecified: Secondary | ICD-10-CM | POA: Diagnosis not present

## 2024-04-05 ENCOUNTER — Ambulatory Visit

## 2024-04-05 ENCOUNTER — Other Ambulatory Visit (HOSPITAL_COMMUNITY)
Admission: RE | Admit: 2024-04-05 | Discharge: 2024-04-05 | Disposition: A | Discharge status: Home / Self Care | Source: Ambulatory Visit | Attending: Obstetrics and Gynecology | Admitting: Obstetrics and Gynecology

## 2024-04-05 VITALS — BP 123/77 | HR 73 | Ht 66.0 in | Wt 162.0 lb

## 2024-04-05 DIAGNOSIS — Z3491 Encounter for supervision of normal pregnancy, unspecified, first trimester: Secondary | ICD-10-CM | POA: Insufficient documentation

## 2024-04-05 DIAGNOSIS — Z3A08 8 weeks gestation of pregnancy: Secondary | ICD-10-CM | POA: Insufficient documentation

## 2024-04-05 DIAGNOSIS — N926 Irregular menstruation, unspecified: Secondary | ICD-10-CM

## 2024-04-05 LAB — POCT URINE PREGNANCY: Preg Test, Ur: POSITIVE — AB

## 2024-04-05 NOTE — Progress Notes (Signed)
 Maureen Carlson here for a UPT. .Pt reported miscarried 01/27/2024 with home test, then bleeding, then re-test after bleeding negative. Pt had a positive upt at home 02/26/2024. Pt is currently breastfeeding, last LMP prior to miscarriage in October 2025. Pt reported had ultrasound last Monday 03/28/24 and reported was 7 weeks 3 days at [facility]. Pt reported would have ultrasound faxed to office. Showed ultrasound picture to RN at visit.   UPT in office Positive.    Reviewed medications and informed to start a PNV, if not already. Pt to follow up in 4 weeks for New OB visit.    New OB Intake  I explained I am completing New OB Intake today. We discussed EDD of 11/11/2024, by Patient Reported. Pt is [id number]. I reviewed her allergies, medications and Medical/Surgical/OB history.    Patient Active Problem List   Diagnosis Date Noted   History of prior pregnancy with intrauterine growth restricted newborn 11/23/2023   History of gestational diabetes mellitus 10/24/2021   Pes anserine bursitis 12/18/2020    Concerns addressed today  Delivery Plans Plans to deliver at [facility]. Discussed the nature of our practice with multiple providers including residents and students. Due to the size of the practice, the delivering provider may not be the same as those providing prenatal care.   MyChart/Babyscripts MyChart access verified. I explained pt will have some visits in office and some virtually. Babyscripts app discussed and ordered.   Blood Pressure Cuff Blood pressure cuff discussed Discussed to be used for virtual visits and or if needed BP checks weekly.  Anatomy US  Explained first scheduled US  will be around 19 weeks for anatomy.   Last Pap No results found for: DIAGPAP  First visit review I reviewed new OB appt with patient. Explained pt will be seen by a provider at first visit. Discussed [place] genetic screening with patient. Routine prenatal labs to be completed at  new OB appointment.    Silvano ORN Arpin, [place] 04/05/2024  11:47 AM

## 2024-04-06 LAB — CERVICOVAGINAL ANCILLARY ONLY
Chlamydia: NEGATIVE
Comment: NEGATIVE
Comment: NORMAL
Neisseria Gonorrhea: NEGATIVE

## 2024-04-07 LAB — URINE CULTURE, OB REFLEX

## 2024-04-07 LAB — CULTURE, OB URINE

## 2024-04-21 ENCOUNTER — Ambulatory Visit: Admitting: Obstetrics and Gynecology

## 2024-04-21 ENCOUNTER — Encounter: Payer: Self-pay | Admitting: Obstetrics and Gynecology

## 2024-04-21 VITALS — BP 124/86 | HR 77 | Wt 163.0 lb

## 2024-04-21 DIAGNOSIS — Z131 Encounter for screening for diabetes mellitus: Secondary | ICD-10-CM

## 2024-04-21 DIAGNOSIS — Z8759 Personal history of other complications of pregnancy, childbirth and the puerperium: Secondary | ICD-10-CM

## 2024-04-21 DIAGNOSIS — Z3480 Encounter for supervision of other normal pregnancy, unspecified trimester: Secondary | ICD-10-CM | POA: Insufficient documentation

## 2024-04-21 DIAGNOSIS — Z3A10 10 weeks gestation of pregnancy: Secondary | ICD-10-CM

## 2024-04-21 DIAGNOSIS — Z8632 Personal history of gestational diabetes: Secondary | ICD-10-CM

## 2024-04-21 NOTE — Progress Notes (Signed)
 "  History:  Maureen Carlson is a 34 y.o. [id number] at [redacted]w[redacted]d by early ultrasound being seen today for her first obstetrical visit.   Patient does intend to breast feed.   Pregnancy history fully reviewed. Obstetrical history is significant for 3 FT SVD. G1 was told FGR but then 2600g at delivery.  G2 had GDM - diet controlled.  G3 - no GDM, no FGR. Told macrosomia, but then normal size (comparable to G2)  Patient reports no complaints.  HISTORY: OB History  Gravida Para Term Preterm AB Living  5 3 3  0 1 3  SAB IAB Ectopic Multiple Live Births  1 0 0 0 3    # Outcome Date GA Lbr Len/2nd Weight Sex Type Anes PTL Lv  5 Current           4 SAB 01/27/24          3 Term 04/10/22 [redacted]w[redacted]d 05:55 / 00:07 8 lb 3 oz (3.714 kg) M Vag-Spont EPI  LIV     Birth Comments: facial bruising     Name: Maureen Carlson     Apgar1: 9  Apgar5: 9  2 Term 10/14/20 [redacted]w[redacted]d 06:32 / 00:18 7 lb 15.2 oz (3.605 kg) F Vag-Spont EPI  LIV     Name: Maureen Carlson     Apgar1: 8  Apgar5: 9  1 Term 12/24/16 [redacted]w[redacted]d 03:35 / 01:17 5 lb 12.6 oz (2.625 kg) M Vag-Spont EPI  LIV     Birth Comments: WNL      Name: Maureen Carlson     Apgar1: 9  Apgar5: 9     Last pap smear was done 2024 and was normal No results found for: DIAGPAP, HPV, HPVHIGH   Past Medical History:  Diagnosis Date   Gestational diabetes    Headache    Menorrhagia    Past Surgical History:  Procedure Laterality Date   ARTHROSCOPIC REPAIR ACL     TONSILLECTOMY     TONSILLECTOMY     Family History  Problem Relation Age of Onset   Anxiety disorder Mother    Heart attack Mother    Anemia Mother    Pyelonephritis Mother    Thyroid disease Mother    Anxiety disorder Paternal Uncle    Multiple births Maternal Grandmother    Thyroid disease Maternal Grandmother    Cancer Paternal Grandfather    Anxiety disorder Paternal Grandfather    Hypertension Father    Asthma Brother    Social History[1] Allergies[2] Medications Ordered Prior to  Encounter[3]  Review of Systems Pertinent items noted in HPI and remainder of comprehensive ROS otherwise negative.  Physical Exam:   Vitals:   04/21/24 1011  BP: 124/86  Pulse: 77  Weight: 163 lb (73.9 kg)   Fetal Heart Rate (bpm): 157 (unable to doppler, provider notified, bedside US  MM Mode 157)  Patient informed that the ultrasound is considered a limited obstetric ultrasound and is not intended to be a complete ultrasound exam.  Patient also informed that the ultrasound is not being completed with the intent of assessing for fetal or placental anomalies or any pelvic abnormalities.  Explained that the purpose of todays ultrasound is to assess for fetal heart rate.  Patient acknowledges the purpose of the exam and the limitations of the study.  General: well-developed, well-nourished female in no acute distress  Skin: normal coloration and turgor, no rashes  Neurologic: oriented, normal, negative, normal mood  Extremities: normal strength, tone, and muscle mass, ROM of  all joints is normal  HEENT PERRLA, extraocular movement intact and sclera clear, anicteric  Neck supple and no masses  Cardiovascular: regular rate and rhythm  Respiratory:  no respiratory distress, normal breath sounds  Abdomen: soft, non-tender; bowel sounds normal; no masses,  no organomegaly   Assessment:    Pregnancy: [id number] Patient Active Problem List   Diagnosis Date Noted   Supervision of other normal pregnancy, antepartum 04/21/2024   History of prior pregnancy with intrauterine growth restricted newborn 11/23/2023   History of gestational diabetes mellitus 10/24/2021     Plan:    1. History of prior pregnancy with intrauterine growth restricted newborn (Primary) Will order growth for third trimester.  - Babyscripts Schedule Optimization  2. History of gestational diabetes mellitus - Early A1C.   3. Pregnancy with 10 completed weeks gestation - CBC/D/Plt+RPR+Rh+ABO+RubIgG... - Hemoglobin  A1c - PANORAMA PRENATAL TEST - US  MFM OB DETAIL +14 WK; Future - Babyscripts Schedule Optimization  4. Screening for diabetes mellitus - Hemoglobin A1c  5. Supervision of other normal pregnancy, antepartum Initial labs ordered. Continue prenatal vitamins. Problem list reviewed and updated. Genetic Screening discussed, NIPS: ordered. Ultrasound discussed; fetal anatomic survey: ordered. Anticipatory guidance about prenatal visits given including labs, ultrasounds, and testing. Discussed usage of Babyscripts and virtual visits. Cuff given.    The nature of [facility]Faculty Practice with multiple MDs and Advanced Practice Providers was explained to patient; also emphasized that residents, students are part of our team. Routine obstetric precautions reviewed. Encouraged to seek out care at office or emergency room [facility] preferred) for urgent and/or emergent concerns. Return in about 4 weeks (around 05/19/2024) for OB VISIT, MD or APP, babyscripts.    [name], MD, FACOG Obstetrician & Gynecologist, [facility] for [employer], [facility]     [1]  Social History Tobacco Use   Smoking status: Never   Smokeless tobacco: Never  Vaping Use   Vaping status: Never Used  Substance Use Topics   Alcohol use: Not Currently    Alcohol/week: 1.0 - 2.0 standard drink of alcohol    Types: 1 - 2 Standard drinks or equivalent per week    Comment: socially   Drug use: No  [2] No Known Allergies [3]  Current Outpatient Medications on File Prior to Visit  Medication Sig Dispense Refill   Prenatal Vit-Fe Fumarate-FA (PRENATAL PO) Take 1 tablet by mouth daily.     No current facility-administered medications on file prior to visit.   "

## 2024-04-22 ENCOUNTER — Ambulatory Visit: Payer: Self-pay | Admitting: Obstetrics and Gynecology

## 2024-04-22 DIAGNOSIS — Z3480 Encounter for supervision of other normal pregnancy, unspecified trimester: Secondary | ICD-10-CM

## 2024-04-22 LAB — CBC/D/PLT+RPR+RH+ABO+RUBIGG...
Antibody Screen: NEGATIVE
Basophils Absolute: 0 10*3/uL (ref 0.0–0.2)
Basos: 0 %
EOS (ABSOLUTE): 0 10*3/uL (ref 0.0–0.4)
Eos: 0 %
HCV Ab: NONREACTIVE
HIV Screen 4th Generation wRfx: NONREACTIVE
Hematocrit: 39.8 % (ref 34.0–46.6)
Hemoglobin: 13.5 g/dL (ref 11.1–15.9)
Hepatitis B Surface Ag: NEGATIVE
Immature Grans (Abs): 0 10*3/uL (ref 0.0–0.1)
Immature Granulocytes: 0 %
Lymphocytes Absolute: 1.7 10*3/uL (ref 0.7–3.1)
Lymphs: 18 %
MCH: 30.8 pg (ref 26.6–33.0)
MCHC: 33.9 g/dL (ref 31.5–35.7)
MCV: 91 fL (ref 79–97)
Monocytes Absolute: 0.5 10*3/uL (ref 0.1–0.9)
Monocytes: 6 %
Neutrophils Absolute: 7 10*3/uL (ref 1.4–7.0)
Neutrophils: 76 %
Platelets: 246 10*3/uL (ref 150–450)
RBC: 4.39 x10E6/uL (ref 3.77–5.28)
RDW: 11.8 % (ref 11.7–15.4)
RPR Ser Ql: NONREACTIVE
Rh Factor: POSITIVE
Rubella Antibodies, IGG: 1.61 {index}
WBC: 9.3 10*3/uL (ref 3.4–10.8)

## 2024-04-22 LAB — HEMOGLOBIN A1C
Est. average glucose Bld gHb Est-mCnc: 94 mg/dL
Hgb A1c MFr Bld: 4.9 % (ref 4.8–5.6)

## 2024-04-22 LAB — HCV INTERPRETATION

## 2024-05-16 ENCOUNTER — Encounter: Admitting: Obstetrics & Gynecology

## 2024-05-18 ENCOUNTER — Encounter: Admitting: Obstetrics and Gynecology

## 2024-06-15 ENCOUNTER — Encounter: Admitting: Family Medicine

## 2024-06-28 ENCOUNTER — Encounter: Admitting: Obstetrics and Gynecology

## 2024-07-06 ENCOUNTER — Other Ambulatory Visit
